# Patient Record
Sex: Female | Born: 1970 | Race: Asian | Hispanic: No | Marital: Married | Smoking: Never smoker
Health system: Southern US, Community
[De-identification: ages and names within clinical notes are randomized; demographics above are authoritative.]

---

## 2009-04-24 ENCOUNTER — Ambulatory Visit: Payer: Self-pay | Admitting: Diagnostic Radiology

## 2009-04-24 ENCOUNTER — Ambulatory Visit (HOSPITAL_BASED_OUTPATIENT_CLINIC_OR_DEPARTMENT_OTHER): Admission: RE | Admit: 2009-04-24 | Discharge: 2009-04-24 | Payer: Self-pay | Admitting: Family Medicine

## 2018-11-28 NOTE — Progress Notes (Addendum)
[facility] [address], [address] [place], [place] [phone] [phone]  Date:  12/02/2018   Name:  Debbie EmeryChih Chuan Mendez   DOB:  January 21, 1971   MRN:  [id number]  PCP:  Pearline Cablesopland, Jessica C, MD    Chief Complaint: No chief complaint on file.   History of Present Illness:  Debbie Mendez is a 47 y.o. 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, she is studying online mostly She has 2 boys; they are 5417 and 4415 yos  She has generally been in good health Never in the hospital except for delivery No operations  She enjoys singing in her free time She takes a probioitc   Never a smoker She enjoys running for exercise  Never yet had a mammo No recent labs Most recent pap about a year ago No abnormal pap in the past  Her brother had what sounds like renal cell carcinoma but otherwise no family history of cancer  She is not fasting- had a light meal She might like to do a mammo this am as well  She may get some lower back pain prior to her menses  Menses are regular  She is not taking any medication for this   She note that she is not sleeping that well She may clench her teeth She may feel tired during the day She is getting about 7 hours of sleep She has a hard time getting her brain to shut off before bed   She thinks that she had a tetanus in 2015 Declines a flu shot   There are no active problems to display for this patient.   History reviewed. No pertinent past medical history.  History reviewed. No pertinent surgical history.  Social History   Tobacco Use  . Smoking status: Never Smoker  . Smokeless tobacco: Never Used  Substance Use Topics  . Alcohol use: Yes    Comment: very rarelly  . Drug use: Never    Family History  Problem  Relation Age of Onset  . Hypertension Mother   . Arthritis Mother   . Kidney disease Brother   . Renal cancer Brother     Not on File  Medication list has been reviewed and updated.  Current Outpatient Medications on File Prior to Visit  Medication Sig Dispense Refill  . lactobacillus acidophilus (BACID) TABS tablet Take 2 tablets by mouth 3 (three) times daily.     No current facility-administered medications on file prior to visit.     Review of Systems:  As per HPI- otherwise negative.   Physical Examination: Vitals:   12/02/18 0909  BP: 140/79  Pulse: 68  Resp: 16  Temp: 98.4 F (36.9 C)  SpO2: 98%   Vitals:   12/02/18 0909  Weight: 131 lb (59.4 kg)  Height: 5' 2.5" (1.588 m)   Body mass index is 23.58 kg/m. Ideal Body Weight: Weight in (lb) to have BMI = 25: 138.6  GEN: WDWN, NAD, Non-toxic, A & O x 3, normal weight, looks well  HEENT: Atraumatic, Normocephalic. Neck supple. No masses, No LAD. Ears and Nose: No external deformity. CV: RRR, No M/G/R. No JVD. No thrill. No extra heart sounds. PULM: CTA B, no wheezes, crackles, rhonchi. No  retractions. No resp. distress. No accessory muscle use. ABD: S, NT, ND. No rebound. No HSM. No lower back tenderness to exam today  EXTR: No c/c/e NEURO Normal gait.  PSYCH: Normally interactive. Conversant. Not depressed or anxious appearing.  Calm demeanor.    Assessment and Plan: Screening for deficiency anemia - Plan: CBC  Screening for diabetes mellitus - Plan: Comprehensive metabolic panel, Hemoglobin A1c  Screening for hyperlipidemia - Plan: Lipid panel  Screening for breast cancer - Plan: MM 3D SCREEN BREAST BILATERAL  Here today as a new patient to establish care Declines flu shot today She thinks her Tdap is UTD Ordered a mammo for her today She notes some minor sleep difficulty- suggested melatonin. She will let me know if not helpful Suggested that she try ibuprofen for lower back ache with menses    Asked her to see me in 6 months for a CPE   Signed Abbe Amsterdam, MD  Received her labs, letter to pt Results for orders placed or performed in visit on 12/02/18  CBC  Result Value Ref Range   WBC 8.1 4.0 - 10.5 K/uL   RBC 4.30 3.87 - 5.11 Mil/uL   Platelets 238.0 150.0 - 400.0 K/uL   Hemoglobin 13.3 12.0 - 15.0 g/dL   HCT 78.2 95.6 - 21.3 %   MCV 92.2 78.0 - 100.0 fl   MCHC 33.4 30.0 - 36.0 g/dL   RDW 08.6 57.8 - 46.9 %  Comprehensive metabolic panel  Result Value Ref Range   Sodium 139 135 - 145 mEq/L   Potassium 4.2 3.5 - 5.1 mEq/L   Chloride 104 96 - 112 mEq/L   CO2 30 19 - 32 mEq/L   Glucose, Bld 89 70 - 99 mg/dL   BUN 12 6 - 23 mg/dL   Creatinine, Ser 6.29 0.40 - 1.20 mg/dL   Total Bilirubin 0.8 0.2 - 1.2 mg/dL   Alkaline Phosphatase 41 39 - 117 U/L   AST 15 0 - 37 U/L   ALT 16 0 - 35 U/L   Total Protein 6.8 6.0 - 8.3 g/dL   Albumin 4.2 3.5 - 5.2 g/dL   Calcium 8.8 8.4 - 52.8 mg/dL   GFR 41.32 >44.01 mL/min  Hemoglobin A1c  Result Value Ref Range   Hgb A1c MFr Bld 5.6 4.6 - 6.5 %  Lipid panel  Result Value Ref Range   Cholesterol 165 0 - 200 mg/dL   Triglycerides 02.7 0.0 - 149.0 mg/dL   HDL 25.36 >64.40 mg/dL   VLDL 34.7 0.0 - 42.5 mg/dL   LDL Cholesterol 90 0 - 99 mg/dL   Total CHOL/HDL Ratio 3    NonHDL 107.08    The 10-year ASCVD risk score Denman George DC Jr., et al., 2013) is: 0.8%   Values used to calculate the score:     Age: 78 years     Sex: Female     Is Non-Hispanic African American: No     Diabetic: No     Tobacco smoker: No     Systolic Blood Pressure: 140 mmHg     Is BP treated: No     HDL Cholesterol: 58.3 mg/dL     Total Cholesterol: 165 mg/dL

## 2018-12-02 ENCOUNTER — Ambulatory Visit (INDEPENDENT_AMBULATORY_CARE_PROVIDER_SITE_OTHER): Payer: 59 | Admitting: Family Medicine

## 2018-12-02 ENCOUNTER — Encounter: Payer: Self-pay | Admitting: Family Medicine

## 2018-12-02 VITALS — BP 140/79 | HR 68 | Temp 98.4°F | Resp 16 | Ht 62.5 in | Wt 131.0 lb

## 2018-12-02 DIAGNOSIS — Z13220 Encounter for screening for lipoid disorders: Secondary | ICD-10-CM

## 2018-12-02 DIAGNOSIS — Z130 Encounter for screening for diseases of the blood and blood-forming organs and certain disorders involving the immune mechanism: Secondary | ICD-10-CM | POA: Diagnosis not present

## 2018-12-02 DIAGNOSIS — Z131 Encounter for screening for diabetes mellitus: Secondary | ICD-10-CM

## 2018-12-02 DIAGNOSIS — Z1239 Encounter for other screening for malignant neoplasm of breast: Secondary | ICD-10-CM

## 2018-12-02 LAB — HEMOGLOBIN A1C: HEMOGLOBIN A1C: 5.6 % (ref 4.6–6.5)

## 2018-12-02 LAB — CBC
HEMATOCRIT: 39.7 % (ref 36.0–46.0)
HEMOGLOBIN: 13.3 g/dL (ref 12.0–15.0)
MCHC: 33.4 g/dL (ref 30.0–36.0)
MCV: 92.2 fl (ref 78.0–100.0)
Platelets: 238 10*3/uL (ref 150.0–400.0)
RBC: 4.3 Mil/uL (ref 3.87–5.11)
RDW: 12.9 % (ref 11.5–15.5)
WBC: 8.1 10*3/uL (ref 4.0–10.5)

## 2018-12-02 LAB — LIPID PANEL
CHOL/HDL RATIO: 3
Cholesterol: 165 mg/dL (ref 0–200)
HDL: 58.3 mg/dL (ref >39.00)
LDL Cholesterol: 90 mg/dL (ref 0–99)
NonHDL: 107.08
TRIGLYCERIDES: 87 mg/dL (ref 0.0–149.0)
VLDL: 17.4 mg/dL (ref 0.0–40.0)

## 2018-12-02 LAB — COMPREHENSIVE METABOLIC PANEL
ALK PHOS: 41 U/L (ref 39–117)
ALT: 16 U/L (ref 0–35)
AST: 15 U/L (ref 0–37)
Albumin: 4.2 g/dL (ref 3.5–5.2)
BUN: 12 mg/dL (ref 6–23)
CO2: 30 mEq/L (ref 19–32)
Calcium: 8.8 mg/dL (ref 8.4–10.5)
Chloride: 104 mEq/L (ref 96–112)
Creatinine, Ser: 0.69 mg/dL (ref 0.40–1.20)
GFR: 96.53 mL/min (ref >60.00)
GLUCOSE: 89 mg/dL (ref 70–99)
POTASSIUM: 4.2 meq/L (ref 3.5–5.1)
Sodium: 139 mEq/L (ref 135–145)
TOTAL PROTEIN: 6.8 g/dL (ref 6.0–8.3)
Total Bilirubin: 0.8 mg/dL (ref 0.2–1.2)

## 2018-12-02 NOTE — Patient Instructions (Addendum)
It was nice to meet you today- happy holidays!  I will be in touch with your labs asap Please stop by the imaging dept and schedule a mammogram  Let's plan to meet in 6 months for a physical

## 2018-12-04 ENCOUNTER — Ambulatory Visit (HOSPITAL_BASED_OUTPATIENT_CLINIC_OR_DEPARTMENT_OTHER)
Admission: RE | Admit: 2018-12-04 | Discharge: 2018-12-04 | Disposition: A | Discharge status: Home / Self Care | Payer: 59 | Source: Ambulatory Visit | Attending: Family Medicine | Admitting: Family Medicine

## 2018-12-04 DIAGNOSIS — Z1239 Encounter for other screening for malignant neoplasm of breast: Secondary | ICD-10-CM | POA: Diagnosis not present

## 2018-12-04 DIAGNOSIS — Z1231 Encounter for screening mammogram for malignant neoplasm of breast: Secondary | ICD-10-CM | POA: Diagnosis not present

## 2018-12-11 ENCOUNTER — Ambulatory Visit (HOSPITAL_BASED_OUTPATIENT_CLINIC_OR_DEPARTMENT_OTHER): Payer: 59

## 2019-12-17 NOTE — Progress Notes (Deleted)
[place] at [facility] [address], [place], [place] [address] [phone]  Date:  12/19/2019   Name:  Debbie Mendez   DOB:  07-28-1971   MRN:  [id number]  PCP:  Darreld Mclean, MD    Chief Complaint: No chief complaint on file.   History of Present Illness:  Debbie Mendez is a 48 y.o. very pleasant female patient who presents with the following:  Generally healthy young woman here today for a CPE Last seen by myself 04/2018  She is a Database administrator, has 2 teenage boys.  She enjoys running for exercise Mammogram-December 2019 Pap Flu vaccine Tetanus vaccine Colon cancer screening Routine labs-now due  There are no problems to display for this patient.   No past medical history on file.  No past surgical history on file.  Social History   Tobacco Use  . Smoking status: Never Smoker  . Smokeless tobacco: Never Used  Substance Use Topics  . Alcohol use: Yes    Comment: very rarelly  . Drug use: Never    Family History  Problem Relation Age of Onset  . Hypertension Mother   . Arthritis Mother   . Kidney disease Brother   . Renal cancer Brother     Not on File  Medication list has been reviewed and updated.  Current Outpatient Medications on File Prior to Visit  Medication Sig Dispense Refill  . lactobacillus acidophilus (BACID) TABS tablet Take 2 tablets by mouth 3 (three) times daily.     No current facility-administered medications on file prior to visit.    Review of Systems:  As per HPI- otherwise negative.   Physical Examination: There were no vitals filed for this visit. There were no vitals filed for this visit. There is no height or weight on file to calculate BMI. Ideal Body Weight:    GEN: WDWN, NAD, Non-toxic, A & O x 3 HEENT: Atraumatic, Normocephalic. Neck supple. No masses, No LAD. Ears and Nose: No external deformity. CV: RRR, No M/G/R. No JVD. No thrill. No extra  heart sounds. PULM: CTA B, no wheezes, crackles, rhonchi. No retractions. No resp. distress. No accessory muscle use. ABD: S, NT, ND, +BS. No rebound. No HSM. EXTR: No c/c/e NEURO Normal gait.  PSYCH: Normally interactive. Conversant. Not depressed or anxious appearing.  Calm demeanor.    Assessment and Plan: *** This visit occurred during the SARS-CoV-2 public health emergency.  Safety protocols were in place, including screening questions prior to the visit, additional usage of staff PPE, and extensive cleaning of exam room while observing appropriate contact time as indicated for disinfecting solutions.    Signed Lamar Blinks, MD

## 2019-12-19 ENCOUNTER — Ambulatory Visit (INDEPENDENT_AMBULATORY_CARE_PROVIDER_SITE_OTHER): Payer: 59 | Admitting: Family Medicine

## 2019-12-19 ENCOUNTER — Other Ambulatory Visit: Payer: Self-pay

## 2019-12-19 DIAGNOSIS — Z1231 Encounter for screening mammogram for malignant neoplasm of breast: Secondary | ICD-10-CM

## 2019-12-19 DIAGNOSIS — Z0000 Encounter for general adult medical examination without abnormal findings: Secondary | ICD-10-CM

## 2019-12-19 DIAGNOSIS — Z131 Encounter for screening for diabetes mellitus: Secondary | ICD-10-CM | POA: Diagnosis not present

## 2019-12-19 DIAGNOSIS — Z1211 Encounter for screening for malignant neoplasm of colon: Secondary | ICD-10-CM

## 2019-12-19 DIAGNOSIS — Z130 Encounter for screening for diseases of the blood and blood-forming organs and certain disorders involving the immune mechanism: Secondary | ICD-10-CM | POA: Diagnosis not present

## 2019-12-19 DIAGNOSIS — Z1329 Encounter for screening for other suspected endocrine disorder: Secondary | ICD-10-CM

## 2019-12-19 DIAGNOSIS — Z13220 Encounter for screening for lipoid disorders: Secondary | ICD-10-CM

## 2019-12-19 DIAGNOSIS — Z114 Encounter for screening for human immunodeficiency virus [HIV]: Secondary | ICD-10-CM

## 2019-12-22 ENCOUNTER — Other Ambulatory Visit: Payer: Self-pay

## 2019-12-22 ENCOUNTER — Ambulatory Visit (INDEPENDENT_AMBULATORY_CARE_PROVIDER_SITE_OTHER): Payer: 59 | Admitting: Family Medicine

## 2019-12-22 ENCOUNTER — Encounter: Payer: Self-pay | Admitting: Family Medicine

## 2019-12-22 DIAGNOSIS — I10 Essential (primary) hypertension: Secondary | ICD-10-CM | POA: Diagnosis not present

## 2019-12-22 MED ORDER — AMLODIPINE BESYLATE 5 MG PO TABS: 5.0000 mg | ORAL_TABLET | Freq: Every day | ORAL | 3 refills | Status: DC

## 2019-12-22 NOTE — Progress Notes (Signed)
[place] at [facility] [address], [place], [place] [address] [phone]  Date:  12/22/2019   Name:  Debbie Mendez   DOB:  1971-03-06   MRN:  [id number]  PCP:  Darreld Mclean, MD    Chief Complaint: No chief complaint on file.   History of Present Illness:  Debbie Mendez is a 48 y.o. very pleasant female patient who presents with the following:  Virtual visit today due to pandemic.  Patient location is home, provider location is home Patient identity confirmed with 2 factors, she gives consent for virtual visit today The patient and myself are present on the call today Connected via video call  We are doing a follow-up visit today Generally healthy young woman, last seen by myself about 1 year ago  She is in [place], she has 2 teenage sons age 58 and 75 Her oldest is currently applying to colleges, he hopes to go to [employer] Most recent labs 1 year ago Pap- may be due  Tetanus? Flu vaccine- done at her husband's job, in November Mammogram 1 year ago  Her main concern today is elevated blood pressure She has been checking her BP at home for the last couple of weeks She is getting readings of 140-160/85-95 Pulse running 50-60 Her mother and father have HTN- no family history of CAD however  She also mentions a feeling of tightness in her chest for a month or so Can occur any time, may last several hours or a day She denies chest pain The chest tightness may occur once a week or so She last noticed this approximately 3 weeks ago  No symptoms at this time  For exercise she will typically run 1-2x a week.   No CP or tightness with running  She does feel like she may be having some anxiety- she has a lot of pressure on her, a lot of stress Her brother was dx with stage 4 kidney cancer just recently- he lives in [place] Her father has worsening dementia- her mom and dad live in [place] as  well  She is also in school, has 2 sons 1 of whom is a high school senior this year  Her most recent weight about 130 lbs No chance of current pregnancy  BP Readings from Last 3 Encounters:  12/02/18 140/79    There are no problems to display for this patient.   No past medical history on file.  No past surgical history on file.  Social History   Tobacco Use  . Smoking status: Never Smoker  . Smokeless tobacco: Never Used  Substance Use Topics  . Alcohol use: Yes    Comment: very rarelly  . Drug use: Never    Family History  Problem Relation Age of Onset  . Hypertension Mother   . Arthritis Mother   . Kidney disease Brother   . Renal cancer Brother     Not on File  Medication list has been reviewed and updated.  Current Outpatient Medications on File Prior to Visit  Medication Sig Dispense Refill  . lactobacillus acidophilus (BACID) TABS tablet Take 2 tablets by mouth 3 (three) times daily.     No current facility-administered medications on file prior to visit.    Review of Systems:  As per HPI- otherwise negative.   Physical Examination: There were no vitals filed for this visit. There were no vitals filed for this  visit. There is no height or weight on file to calculate BMI. Ideal Body Weight:     Patient is checking blood pressure and pulse at home as described above Pt observed via video monitor- she looks well, no cough, wheezing, or shortness of breath noted  Assessment and Plan: Essential hypertension - Plan: amLODipine (NORVASC) 5 MG tablet  Virtual visit today for concern of high blood pressure.  Patient has noted moderately high blood pressure on several occasions at home.  She also had a borderline blood pressure in my office last year, family history of high blood pressure  We will have her start on amlodipine, called in 5 mg.  We will have her start with 1/2 tablet first as 2-1/2 mg may be sufficient.  I have encouraged her to continue  checking her blood pressure and please jot down some readings for me.  We will plan to bring her into the office to be seen in the next couple of weeks.  I did offer to bring her in today for EKG, chest x-ray, labs.  She declines to come into the office today.  Her most recent episode of chest tightness was about 3 weeks ago, currently asymptomatic.  I have asked her to seek immediate care if the symptoms return prior to our visit, and she agrees to do so  Signed Abbe Amsterdam, MD

## 2020-06-27 DIAGNOSIS — I10 Essential (primary) hypertension: Secondary | ICD-10-CM | POA: Insufficient documentation

## 2020-06-27 NOTE — Progress Notes (Signed)
[facility] at [facility] [address] [place], [place] [address] [phone]  Date:  06/28/2020   Name:  Debbie Mendez   DOB:  06-23-1971   MRN:  [id number]  PCP:  Pearline Cables, MD    Chief Complaint: Annual Exam (with pap)   History of Present Illness:  Debbie Mendez is a 49 y.o. very pleasant female patient who presents with the following:  Woman with history of essential hypertension, here today for physical exam Last seen by myself in December 2020 At that time she was checking her blood pressure at home, had noted some elevated numbers with systolic running 932- 160; we started her on amlodipine 5 mg; she is taking 2.5 mg now and her BP seems to be under good control   She is married with 2 teenage sons- they are doing well, 20 and 62 yo.  One is going to [employer] school this summer  Her family lives in [place], her brother there has kidney cancer.  Her parents both live in [place], the father is suffering from dementia  Pap today-she does not recall any history of abnormal Pap Mammogram- will order  Colon cancer screening-discussed options with patient today.  She wishes to think about this and will let me know if she prefers colonoscopy versus Cologuard Tetanus vaccine- boost today  Routine labs are due, most recent 2019.   Covid series complete  She enjoys running/ walking for 30 minutes about 3x a week   She does mention that she was sometimes noticed a few minutes of palpitations.  No chest pain or shortness of breath.  She notes this mostly when she is at rest, such as when she is standing.  Does not occur with exercise Patient Active Problem List   Diagnosis Date Noted  . Essential hypertension 06/27/2020    No past medical history on file.  No past surgical history on file.  Social History   Tobacco Use  . Smoking status: Never Smoker  . Smokeless tobacco: Never Used  Vaping Use  . Vaping Use:  Never used  Substance Use Topics  . Alcohol use: Yes    Comment: very rarelly  . Drug use: Never    Family History  Problem Relation Age of Onset  . Hypertension Mother   . Arthritis Mother   . Kidney disease Brother   . Renal cancer Brother     Not on File  Medication list has been reviewed and updated.  Current Outpatient Medications on File Prior to Visit  Medication Sig Dispense Refill  . amLODipine (NORVASC) 5 MG tablet Take 1 tablet (5 mg total) by mouth daily. May start with 2.5 mg (Patient taking differently: Take 2.5 mg by mouth daily. May start with 2.5 mg) 90 tablet 3  . melatonin 1 MG TABS tablet Take 3 mg by mouth at bedtime.     No current facility-administered medications on file prior to visit.    Review of Systems:  As per HPI- otherwise negative.   Physical Examination: Vitals:   06/28/20 0924  BP: 130/86  Pulse: 76  Resp: 17  SpO2: 98%   Vitals:   06/28/20 0924  Weight: 132 lb (59.9 kg)  Height: 5' 2.5" (1.588 m)   Body mass index is 23.76 kg/m. Ideal Body Weight: Weight in (lb) to have BMI = 25: 138.6  GEN: no acute distress.  Looks well, normal weight HEENT: Atraumatic, Normocephalic.  Ears and Nose: No external deformity. CV: RRR, No M/G/R. No JVD. No thrill. No extra heart sounds. PULM: CTA B, no wheezes, crackles, rhonchi. No retractions. No resp. distress. No accessory muscle use. ABD: S, NT, ND, +BS. No rebound. No HSM. EXTR: No c/c/e PSYCH: Normally interactive. Conversant.  Breast: normal exam, no masses/ dimpling/ discharge Pelvic: normal, no vaginal lesions or discharge. Uterus normal, no CMT, no adnexal tendereness or masses   EKG: Normal sinus rhythm, 1 ectopic beat caught on second tracing No old EKG for comparison   Assessment and Plan: Physical exam  Essential hypertension - Plan: CBC, Comprehensive metabolic panel  Screening for deficiency anemia - Plan: CBC  Screening for diabetes mellitus - Plan:  Comprehensive metabolic panel, Hemoglobin A1c  Screening for hyperlipidemia - Plan: Lipid panel  Screening for HIV (human immunodeficiency virus) - Plan: HIV Antibody (routine testing w rflx)  Screening for thyroid disorder - Plan: TSH  Encounter for hepatitis C screening test for low risk patient - Plan: Hepatitis C antibody  Screening for cervical cancer - Plan: Cytology - PAP  Palpitations - Plan: EKG 12-Lead  Immunization due - Plan: Td vaccine greater than or equal to 7yo preservative free IM  Encounter for screening mammogram for malignant neoplasm of breast - Plan: MM 3D SCREEN BREAST BILATERAL  Patient here today for physical exam.  Routine labs, Pap pending as above Updated tetanus Ordered mammogram Patient wished to think about her colon cancer screening options, she will let me know how she would like to proceed  Discussed her occasional palpitations.  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  For the time being she declines, she will let me know if the symptoms worsen or if she develops any symptoms during exercise  Will plan further follow- up pending labs.  This visit occurred during the SARS-CoV-2 public health emergency.  Safety protocols were in place, including screening questions prior to the visit, additional usage of staff PPE, and extensive cleaning of exam room while observing appropriate contact time as indicated for disinfecting solutions.    Signed Abbe Amsterdam, MD

## 2020-06-27 NOTE — Patient Instructions (Signed)
It was great to see you again today, I will be in touch with your labs as soon as possible Please stop by imaging on the ground floor and see if they can do your mammogram You are due for colon cancer screening   Health Maintenance, Female Adopting a healthy lifestyle and getting preventive care are important in promoting health and wellness. Ask your health care provider about:  The right schedule for you to have regular tests and exams.  Things you can do on your own to prevent diseases and keep yourself healthy. What should I know about diet, weight, and exercise? Eat a healthy diet   Eat a diet that includes plenty of vegetables, fruits, low-fat dairy products, and lean protein.  Do not eat a lot of foods that are high in solid fats, added sugars, or sodium. Maintain a healthy weight Body mass index (BMI) is used to identify weight problems. It estimates body fat based on height and weight. Your health care provider can help determine your BMI and help you achieve or maintain a healthy weight. Get regular exercise Get regular exercise. This is one of the most important things you can do for your health. Most adults should:  Exercise for at least 150 minutes each week. The exercise should increase your heart rate and make you sweat (moderate-intensity exercise).  Do strengthening exercises at least twice a week. This is in addition to the moderate-intensity exercise.  Spend less time sitting. Even light physical activity can be beneficial. Watch cholesterol and blood lipids Have your blood tested for lipids and cholesterol at 50 years of age, then have this test every 5 years. Have your cholesterol levels checked more often if:  Your lipid or cholesterol levels are high.  You are older than 50 years of age.  You are at high risk for heart disease. What should I know about cancer screening? Depending on your health history and family history, you may need to have cancer  screening at various ages. This may include screening for:  Breast cancer.  Cervical cancer.  Colorectal cancer.  Skin cancer.  Lung cancer. What should I know about heart disease, diabetes, and high blood pressure? Blood pressure and heart disease  High blood pressure causes heart disease and increases the risk of stroke. This is more likely to develop in people who have high blood pressure readings, are of African descent, or are overweight.  Have your blood pressure checked: ? Every 3-5 years if you are 5-56 years of age. ? Every year if you are 77 years old or older. Diabetes Have regular diabetes screenings. This checks your fasting blood sugar level. Have the screening done:  Once every three years after age 20 if you are at a normal weight and have a low risk for diabetes.  More often and at a younger age if you are overweight or have a high risk for diabetes. What should I know about preventing infection? Hepatitis B If you have a higher risk for hepatitis B, you should be screened for this virus. Talk with your health care provider to find out if you are at risk for hepatitis B infection. Hepatitis C Testing is recommended for:  Everyone born from [birth year] through 1966.  Anyone with known risk factors for hepatitis C. Sexually transmitted infections (STIs)  Get screened for STIs, including gonorrhea and chlamydia, if: ? You are sexually active and are younger than 50 years of age. ? You are older than 50 years of  age and your health care provider tells you that you are at risk for this type of infection. ? Your sexual activity has changed since you were last screened, and you are at increased risk for chlamydia or gonorrhea. Ask your health care provider if you are at risk.  Ask your health care provider about whether you are at high risk for HIV. Your health care provider may recommend a prescription medicine to help prevent HIV infection. If you choose to take  medicine to prevent HIV, you should first get tested for HIV. You should then be tested every 3 months for as long as you are taking the medicine. Pregnancy  If you are about to stop having your period (premenopausal) and you may become pregnant, seek counseling before you get pregnant.  Take 400 to 800 micrograms (mcg) of folic acid every day if you become pregnant.  Ask for birth control (contraception) if you want to prevent pregnancy. Osteoporosis and menopause Osteoporosis is a disease in which the bones lose minerals and strength with aging. This can result in bone fractures. If you are 37 years old or older, or if you are at risk for osteoporosis and fractures, ask your health care provider if you should:  Be screened for bone loss.  Take a calcium or vitamin D supplement to lower your risk of fractures.  Be given hormone replacement therapy (HRT) to treat symptoms of menopause. Follow these instructions at home: Lifestyle  Do not use any products that contain nicotine or tobacco, such as cigarettes, e-cigarettes, and chewing tobacco. If you need help quitting, ask your health care provider.  Do not use street drugs.  Do not share needles.  Ask your health care provider for help if you need support or information about quitting drugs. Alcohol use  Do not drink alcohol if: ? Your health care provider tells you not to drink. ? You are pregnant, may be pregnant, or are planning to become pregnant.  If you drink alcohol: ? Limit how much you use to 0-1 drink a day. ? Limit intake if you are breastfeeding.  Be aware of how much alcohol is in your drink. In the [place] one drink equals one 12 oz bottle of beer (355 mL), one 5 oz glass of wine (148 mL), or one 1 oz glass of hard liquor (44 mL). General instructions  Schedule regular health, dental, and eye exams.  Stay current with your vaccines.  Tell your health care provider if: ? You often feel depressed. ? You have  ever been abused or do not feel safe at home. Summary  Adopting a healthy lifestyle and getting preventive care are important in promoting health and wellness.  Follow your health care provider's instructions about healthy diet, exercising, and getting tested or screened for diseases.  Follow your health care provider's instructions on monitoring your cholesterol and blood pressure. This information is not intended to replace advice given to you by your health care provider. Make sure you discuss any questions you have with your health care provider. Document Revised: 12/08/2018 Document Reviewed: 12/08/2018 Elsevier Patient Education  2020 [employer].

## 2020-06-28 ENCOUNTER — Other Ambulatory Visit (HOSPITAL_COMMUNITY)
Admission: RE | Admit: 2020-06-28 | Discharge: 2020-06-28 | Disposition: A | Discharge status: Home / Self Care | Payer: 59 | Source: Ambulatory Visit | Attending: Family Medicine | Admitting: Family Medicine

## 2020-06-28 ENCOUNTER — Encounter: Payer: Self-pay | Admitting: Family Medicine

## 2020-06-28 ENCOUNTER — Ambulatory Visit (INDEPENDENT_AMBULATORY_CARE_PROVIDER_SITE_OTHER): Payer: 59 | Admitting: Family Medicine

## 2020-06-28 ENCOUNTER — Other Ambulatory Visit: Payer: Self-pay

## 2020-06-28 VITALS — BP 130/86 | HR 76 | Resp 17 | Ht 62.5 in | Wt 132.0 lb

## 2020-06-28 DIAGNOSIS — Z131 Encounter for screening for diabetes mellitus: Secondary | ICD-10-CM | POA: Diagnosis not present

## 2020-06-28 DIAGNOSIS — Z23 Encounter for immunization: Secondary | ICD-10-CM | POA: Diagnosis not present

## 2020-06-28 DIAGNOSIS — Z130 Encounter for screening for diseases of the blood and blood-forming organs and certain disorders involving the immune mechanism: Secondary | ICD-10-CM | POA: Diagnosis not present

## 2020-06-28 DIAGNOSIS — Z0000 Encounter for general adult medical examination without abnormal findings: Secondary | ICD-10-CM | POA: Diagnosis not present

## 2020-06-28 DIAGNOSIS — Z114 Encounter for screening for human immunodeficiency virus [HIV]: Secondary | ICD-10-CM

## 2020-06-28 DIAGNOSIS — Z124 Encounter for screening for malignant neoplasm of cervix: Secondary | ICD-10-CM | POA: Insufficient documentation

## 2020-06-28 DIAGNOSIS — R002 Palpitations: Secondary | ICD-10-CM | POA: Diagnosis not present

## 2020-06-28 DIAGNOSIS — Z1231 Encounter for screening mammogram for malignant neoplasm of breast: Secondary | ICD-10-CM

## 2020-06-28 DIAGNOSIS — I10 Essential (primary) hypertension: Secondary | ICD-10-CM

## 2020-06-28 DIAGNOSIS — Z1329 Encounter for screening for other suspected endocrine disorder: Secondary | ICD-10-CM

## 2020-06-28 DIAGNOSIS — Z13220 Encounter for screening for lipoid disorders: Secondary | ICD-10-CM

## 2020-06-28 DIAGNOSIS — Z1159 Encounter for screening for other viral diseases: Secondary | ICD-10-CM

## 2020-07-03 ENCOUNTER — Encounter: Payer: Self-pay | Admitting: Family Medicine

## 2020-07-03 LAB — CYTOLOGY - PAP
Comment: NEGATIVE
Diagnosis: NEGATIVE
High risk HPV: NEGATIVE

## 2020-07-05 ENCOUNTER — Ambulatory Visit (HOSPITAL_BASED_OUTPATIENT_CLINIC_OR_DEPARTMENT_OTHER)
Admission: RE | Admit: 2020-07-05 | Discharge: 2020-07-05 | Disposition: A | Discharge status: Home / Self Care | Payer: 59 | Source: Ambulatory Visit | Attending: Family Medicine | Admitting: Family Medicine

## 2020-07-05 ENCOUNTER — Other Ambulatory Visit: Payer: Self-pay

## 2020-07-05 DIAGNOSIS — Z1231 Encounter for screening mammogram for malignant neoplasm of breast: Secondary | ICD-10-CM | POA: Diagnosis present

## 2021-06-13 ENCOUNTER — Other Ambulatory Visit (HOSPITAL_BASED_OUTPATIENT_CLINIC_OR_DEPARTMENT_OTHER): Payer: Self-pay | Admitting: Family Medicine

## 2021-06-13 DIAGNOSIS — Z1231 Encounter for screening mammogram for malignant neoplasm of breast: Secondary | ICD-10-CM

## 2021-06-19 ENCOUNTER — Other Ambulatory Visit: Payer: Self-pay

## 2021-06-19 ENCOUNTER — Telehealth (INDEPENDENT_AMBULATORY_CARE_PROVIDER_SITE_OTHER): Payer: 59 | Admitting: Family Medicine

## 2021-06-19 ENCOUNTER — Encounter: Payer: Self-pay | Admitting: Family Medicine

## 2021-06-19 DIAGNOSIS — U071 COVID-19: Secondary | ICD-10-CM | POA: Diagnosis not present

## 2021-06-19 NOTE — Progress Notes (Signed)
Chief Complaint  Patient presents with   Covid Positive   Fever   Cough   Sore Throat    Debbie Mendez here for URI complaints. Due to COVID-19 pandemic, we are interacting via web portal for an electronic face-to-face visit. I verified patient's ID using 2 identifiers. Patient agreed to proceed with visit via this method. Patient is at home, I am at office. Patient and I are present for visit.   Duration: 2 days  Associated symptoms: Fever (38.5 C), sinus congestion, rhinorrhea, sore throat, myalgia, fatigue, chills, and cough Denies: sinus pain, itchy watery eyes, ear pain, ear drainage, wheezing, shortness of breath, and N/V/D, loss of taste smell Treatment to date: Tylenol, Mucinex Sick contacts: Yes; son Tested positive yesterday She is triple vaccinated; last one around Dec.   History reviewed. No pertinent past medical history.  Objective No conversational dyspnea Age appropriate judgment and insight Nml affect and mood  COVID-19  Cont Tylenol/ibuprofen prn. Discussed quarantining measures. Too young/healthy for antiviral.  Continue to push fluids, practice good hand hygiene, cover mouth when coughing. F/u prn. If starting to experience fevers, shaking, or shortness of breath, seek immediate care. Pt voiced understanding and agreement to the plan.  [name], DO 06/19/21 1:27 PM

## 2021-07-05 NOTE — Progress Notes (Addendum)
Debbie Mendez [address] [place], [place] [address] [phone]  Date:  07/11/2021   Name:  Debbie Mendez   DOB:  22-Oct-1971   MRN:  [id number]  PCP:  Debbie Cables, MD    Chief Complaint: Annual Exam (Concerns/ questions: none/Hep C screen due/Colon screen due/Zoster: none)   History of Present Illness:  Debbie Mendez is a 50 y.o. very pleasant female patient who presents with the following: Here today for a CPE Last seen by myself about one year ago- she must have forgotten to go to lab at that time  History of controlled HTN Married with two sons, ages 34 and 77 Her parents and other family live in [place] She recently graduated from [employer], she is now working as a IT sales professional.  She returned from admission to [place] recently.  She had covid last month and is now feeling better  Hep C screening Colon- no family history, discussed options, she would like cologuard  Covid booster Shingrix- she wants to think about this for now  Mammo one year ago- she has this scheduled  Pap UTD [facility] 2019- update now   Amlodipine 2.5- she is taking this daily Her BP is generally 120- 130/ 88- 90-at this time she does not want to change her dosage, will continue to monitor her BP   She notes that she sometimes has trouble sleeping- she has noticed this for a year or so, she has been under more stress the last few years with work and family She has tried melatonin which did not help that much  She would like to try trazodone  Patient Active Problem List   Diagnosis Date Noted   Essential hypertension 06/27/2020    No past medical history on file.  No past surgical history on file.  Social History   Tobacco Use   Smoking status: Never   Smokeless tobacco: Never  Vaping Use   Vaping Use: Never used  Substance Use Topics   Alcohol use: Yes    Comment: very rarelly   Drug use: Never    Family History   Problem Relation Age of Onset   Hypertension Mother    Arthritis Mother    Kidney disease Brother    Renal cancer Brother     Not on File  Medication list has been reviewed and updated.  Current Outpatient Medications on File Prior to Visit  Medication Sig Dispense Refill   amLODipine (NORVASC) 5 MG tablet Take 1 tablet (5 mg total) by mouth daily. May start with 2.5 mg (Patient taking differently: Take 2.5 mg by mouth daily. May start with 2.5 mg) 90 tablet 3   melatonin 1 MG TABS tablet Take 3 mg by mouth at bedtime.     No current facility-administered medications on file prior to visit.    Review of Systems:  As per HPI- otherwise negative.   Physical Examination: Vitals:   07/11/21 1034  BP: 130/88  Pulse: 80  Resp: 18  Temp: 98.2 F (36.8 C)  SpO2: 97%   Vitals:   07/11/21 1034  Weight: 134 lb 9.6 oz (61.1 kg)  Height: 5\' 3"  (1.6 m)   Body mass index is 23.84 kg/m. Ideal Body Weight: Weight in (lb) to have BMI = 25: 140.8  GEN: no acute distress. Normal weight. Looks well  HEENT: Atraumatic, Normocephalic.  Ears and Nose: No external deformity. CV: RRR, No M/G/R. No JVD.  No thrill. No extra heart sounds. PULM: CTA B, no wheezes, crackles, rhonchi. No retractions. No resp. distress. No accessory muscle use. ABD: S, NT, ND, +BS. No rebound. No HSM. EXTR: No c/c/e PSYCH: Normally interactive. Conversant.  Breast: normal exam, no masses/ dimpling/ discharge    Assessment and Plan: Physical exam  Screening for diabetes mellitus - Plan: Comprehensive metabolic panel, Hemoglobin A1c  Screening for hyperlipidemia - Plan: Lipid panel  Essential hypertension - Plan: CBC, Comprehensive metabolic panel  Screening for deficiency anemia - Plan: CBC  Encounter for hepatitis C screening test for low risk patient - Plan: Hepatitis C antibody  Fatigue, unspecified type - Plan: TSH, VITAMIN D 25 Hydroxy (Vit-D Deficiency, Fractures)  Primary insomnia - Plan:  traZODone (DESYREL) 50 MG tablet  Screening for colon cancer  Physical exam today Encouraged healthy diet and exercise routine Will plan further follow- up pending labs. Try trazodone for sleep Order cologuard for her  Continue amlodipine   This visit occurred during the SARS-CoV-2 public health emergency.  Safety protocols were in place, including screening questions prior to the visit, additional usage of staff PPE, and extensive cleaning of exam room while observing appropriate contact time as indicated for disinfecting solutions.   Signed Debbie Amsterdam, MD  Received her labs as below, message to pt  Results for orders placed or performed in visit on 07/11/21  CBC  Result Value Ref Range   WBC 5.3 4.0 - 10.5 K/uL   RBC 4.18 3.87 - 5.11 Mil/uL   Platelets 246.0 150.0 - 400.0 K/uL   Hemoglobin 13.1 12.0 - 15.0 g/dL   HCT 32.9 92.4 - 26.8 %   MCV 92.1 78.0 - 100.0 fl   MCHC 34.0 30.0 - 36.0 g/dL   RDW 34.1 96.2 - 22.9 %  Comprehensive metabolic panel  Result Value Ref Range   Sodium 138 135 - 145 mEq/L   Potassium 4.0 3.5 - 5.1 mEq/L   Chloride 104 96 - 112 mEq/L   CO2 26 19 - 32 mEq/L   Glucose, Bld 88 70 - 99 mg/dL   BUN 15 6 - 23 mg/dL   Creatinine, Ser 7.98 0.40 - 1.20 mg/dL   Total Bilirubin 1.2 0.2 - 1.2 mg/dL   Alkaline Phosphatase 44 39 - 117 U/L   AST 15 0 - 37 U/L   ALT 12 0 - 35 U/L   Total Protein 7.3 6.0 - 8.3 g/dL   Albumin 4.5 3.5 - 5.2 g/dL   GFR 92.11 >94.17 mL/min   Calcium 9.0 8.4 - 10.5 mg/dL  TSH  Result Value Ref Range   TSH 0.56 0.35 - 5.50 uIU/mL  VITAMIN D 25 Hydroxy (Vit-D Deficiency, Fractures)  Result Value Ref Range   VITD 27.45 (L) 30.00 - 100.00 ng/mL  Lipid panel  Result Value Ref Range   Cholesterol 192 0 - 200 mg/dL   Triglycerides 408.1 (H) 0.0 - 149.0 mg/dL   HDL 44.81 >85.63 mg/dL   VLDL 14.9 (H) 0.0 - 70.2 mg/dL   Total CHOL/HDL Ratio 3    NonHDL 131.73   Hemoglobin A1c  Result Value Ref Range   Hgb A1c MFr Bld 5.6  4.6 - 6.5 %  LDL cholesterol, direct  Result Value Ref Range   Direct LDL 110.0 mg/dL    The 63-ZCHY ASCVD risk score Denman George DC Jr., et al., 2013) is: 1.5%   Values used to calculate the score:     Age: 35 years     Sex: Female  Is Non-Hispanic African American: No     Diabetic: No     Tobacco smoker: No     Systolic Blood Pressure: 130 mmHg     Is BP treated: Yes     HDL Cholesterol: 59.8 mg/dL     Total Cholesterol: 192 mg/dL

## 2021-07-05 NOTE — Patient Instructions (Signed)
Good to see you again today, I will be in touch with your labs asap  You can try trazodone as needed for insomnia- let me know how this works for you We will send you a Cologuard kit for home colon cancer screening I do recommend that we do the Shingles vaccine for you at some point  Take care!

## 2021-07-11 ENCOUNTER — Other Ambulatory Visit: Payer: Self-pay

## 2021-07-11 ENCOUNTER — Ambulatory Visit (INDEPENDENT_AMBULATORY_CARE_PROVIDER_SITE_OTHER): Payer: 59 | Admitting: Family Medicine

## 2021-07-11 ENCOUNTER — Encounter: Payer: Self-pay | Admitting: Family Medicine

## 2021-07-11 VITALS — BP 130/88 | HR 80 | Temp 98.2°F | Resp 18 | Ht 63.0 in | Wt 134.6 lb

## 2021-07-11 DIAGNOSIS — E559 Vitamin D deficiency, unspecified: Secondary | ICD-10-CM

## 2021-07-11 DIAGNOSIS — I10 Essential (primary) hypertension: Secondary | ICD-10-CM

## 2021-07-11 DIAGNOSIS — Z0000 Encounter for general adult medical examination without abnormal findings: Secondary | ICD-10-CM

## 2021-07-11 DIAGNOSIS — Z1159 Encounter for screening for other viral diseases: Secondary | ICD-10-CM

## 2021-07-11 DIAGNOSIS — R5383 Other fatigue: Secondary | ICD-10-CM | POA: Diagnosis not present

## 2021-07-11 DIAGNOSIS — Z131 Encounter for screening for diabetes mellitus: Secondary | ICD-10-CM

## 2021-07-11 DIAGNOSIS — F5101 Primary insomnia: Secondary | ICD-10-CM

## 2021-07-11 DIAGNOSIS — Z1211 Encounter for screening for malignant neoplasm of colon: Secondary | ICD-10-CM

## 2021-07-11 DIAGNOSIS — Z13220 Encounter for screening for lipoid disorders: Secondary | ICD-10-CM

## 2021-07-11 DIAGNOSIS — Z130 Encounter for screening for diseases of the blood and blood-forming organs and certain disorders involving the immune mechanism: Secondary | ICD-10-CM | POA: Diagnosis not present

## 2021-07-11 LAB — LDL CHOLESTEROL, DIRECT: Direct LDL: 110 mg/dL

## 2021-07-11 LAB — LIPID PANEL
Cholesterol: 192 mg/dL (ref 0–200)
HDL: 59.8 mg/dL (ref >39.00)
NonHDL: 131.73
Total CHOL/HDL Ratio: 3
Triglycerides: 224 mg/dL — ABNORMAL HIGH (ref 0.0–149.0)
VLDL: 44.8 mg/dL — ABNORMAL HIGH (ref 0.0–40.0)

## 2021-07-11 LAB — COMPREHENSIVE METABOLIC PANEL
ALT: 12 U/L (ref 0–35)
AST: 15 U/L (ref 0–37)
Albumin: 4.5 g/dL (ref 3.5–5.2)
Alkaline Phosphatase: 44 U/L (ref 39–117)
BUN: 15 mg/dL (ref 6–23)
CO2: 26 mEq/L (ref 19–32)
Calcium: 9 mg/dL (ref 8.4–10.5)
Chloride: 104 mEq/L (ref 96–112)
Creatinine, Ser: 0.77 mg/dL (ref 0.40–1.20)
GFR: 89.86 mL/min (ref >60.00)
Glucose, Bld: 88 mg/dL (ref 70–99)
Potassium: 4 mEq/L (ref 3.5–5.1)
Sodium: 138 mEq/L (ref 135–145)
Total Bilirubin: 1.2 mg/dL (ref 0.2–1.2)
Total Protein: 7.3 g/dL (ref 6.0–8.3)

## 2021-07-11 LAB — HEMOGLOBIN A1C: Hgb A1c MFr Bld: 5.6 % (ref 4.6–6.5)

## 2021-07-11 LAB — TSH: TSH: 0.56 u[IU]/mL (ref 0.35–5.50)

## 2021-07-11 LAB — CBC
HCT: 38.5 % (ref 36.0–46.0)
Hemoglobin: 13.1 g/dL (ref 12.0–15.0)
MCHC: 34 g/dL (ref 30.0–36.0)
MCV: 92.1 fl (ref 78.0–100.0)
Platelets: 246 10*3/uL (ref 150.0–400.0)
RBC: 4.18 Mil/uL (ref 3.87–5.11)
RDW: 13.2 % (ref 11.5–15.5)
WBC: 5.3 10*3/uL (ref 4.0–10.5)

## 2021-07-11 LAB — VITAMIN D 25 HYDROXY (VIT D DEFICIENCY, FRACTURES): VITD: 27.45 ng/mL — ABNORMAL LOW (ref 30.00–100.00)

## 2021-07-11 MED ORDER — VITAMIN D3 1.25 MG (50000 UT) PO CAPS: ORAL_CAPSULE | ORAL | 0 refills | Status: AC

## 2021-07-11 MED ORDER — TRAZODONE HCL 50 MG PO TABS: 25.0000 mg | ORAL_TABLET | Freq: Every evening | ORAL | 3 refills | Status: DC | PRN

## 2021-07-11 NOTE — Addendum Note (Signed)
Addended by: Abbe Amsterdam C on: 07/11/2021 06:13 PM   Modules accepted: Orders

## 2021-07-12 LAB — HEPATITIS C ANTIBODY
Hepatitis C Ab: NONREACTIVE
SIGNAL TO CUT-OFF: 0.02 (ref <1.00)

## 2021-07-15 ENCOUNTER — Encounter (HOSPITAL_BASED_OUTPATIENT_CLINIC_OR_DEPARTMENT_OTHER): Payer: Self-pay

## 2021-07-15 ENCOUNTER — Other Ambulatory Visit: Payer: Self-pay

## 2021-07-15 ENCOUNTER — Ambulatory Visit (HOSPITAL_BASED_OUTPATIENT_CLINIC_OR_DEPARTMENT_OTHER)
Admission: RE | Admit: 2021-07-15 | Discharge: 2021-07-15 | Disposition: A | Discharge status: Home / Self Care | Payer: 59 | Source: Ambulatory Visit | Attending: Family Medicine | Admitting: Family Medicine

## 2021-07-15 DIAGNOSIS — Z1231 Encounter for screening mammogram for malignant neoplasm of breast: Secondary | ICD-10-CM | POA: Diagnosis present

## 2021-08-19 LAB — COLOGUARD: Cologuard: NEGATIVE

## 2022-01-20 ENCOUNTER — Ambulatory Visit (INDEPENDENT_AMBULATORY_CARE_PROVIDER_SITE_OTHER): Payer: 59 | Admitting: Family Medicine

## 2022-01-20 ENCOUNTER — Encounter: Payer: Self-pay | Admitting: Family Medicine

## 2022-01-20 VITALS — BP 136/86 | HR 60 | Temp 98.2°F | Ht 62.0 in | Wt 135.4 lb

## 2022-01-20 DIAGNOSIS — I10 Essential (primary) hypertension: Secondary | ICD-10-CM

## 2022-01-20 DIAGNOSIS — M545 Low back pain, unspecified: Secondary | ICD-10-CM | POA: Diagnosis not present

## 2022-01-20 MED ORDER — MELOXICAM 15 MG PO TABS: 15.0000 mg | ORAL_TABLET | Freq: Every day | ORAL | 0 refills | Status: DC

## 2022-01-20 MED ORDER — AMLODIPINE BESYLATE 5 MG PO TABS: 5.0000 mg | ORAL_TABLET | Freq: Every day | ORAL | 3 refills | Status: DC

## 2022-01-20 MED ORDER — TIZANIDINE HCL 4 MG PO TABS: 4.0000 mg | ORAL_TABLET | Freq: Four times a day (QID) | ORAL | 0 refills | Status: DC | PRN

## 2022-01-20 NOTE — Progress Notes (Signed)
Musculoskeletal Exam  Patient: Debbie Mendez DOB: March 10, 1971  DOS: 01/20/2022  SUBJECTIVE:  Chief Complaint:   Chief Complaint  Patient presents with   Back Pain    Debbie Mendez is a 51 y.o.  female for evaluation and treatment of back pain.   Onset:  2 weeks ago. No recent inj or change in activity.  Location: lower L Character:  aching and sharp  Progression of issue:  has worsened Associated symptoms: numbness on bottom of feet Denies bowel/bladder incontinence or weakness, bruising, redness, swelling Treatment: to date has been acetaminophen, heat.   Neurovascular symptoms: no  History reviewed. No pertinent past medical history.  Objective:  VITAL SIGNS: BP 136/86    Pulse 60    Temp 98.2 F (36.8 C) (Oral)    Ht 5\' 2"  (1.575 m)    Wt 135 lb 6 oz (61.4 kg)    SpO2 99%    BMI 24.76 kg/m  Constitutional: Well formed, well developed. No acute distress. HENT: Normocephalic, atraumatic.  Thorax & Lungs:  No accessory muscle use Musculoskeletal: low back.   Tenderness to palpation: yes over LL parasp msc Deformity: no Ecchymosis: no Straight leg test: negative for OK hamstring flexibility b/l. Neurologic: Normal sensory function. No focal deficits noted. DTR's equal and symmetric in LE's. No clonus. Psychiatric: Normal mood. Age appropriate judgment and insight. Alert & oriented x 3.    Assessment:  Acute left-sided low back pain without sciatica - Plan: meloxicam (MOBIC) 15 MG tablet, tiZANidine (ZANAFLEX) 4 MG tablet  Essential hypertension - Plan: amLODipine (NORVASC) 5 MG tablet  Plan: Stretches/exercises, heat, ice, Tylenol, NSAIDs. PT if no improvement.  F/u prn. The patient voiced understanding and agreement to the plan.   [place], DO 01/20/22  2:45 PM

## 2022-01-20 NOTE — Patient Instructions (Signed)

## 2022-01-24 ENCOUNTER — Other Ambulatory Visit: Payer: Self-pay | Admitting: Family Medicine

## 2022-01-24 DIAGNOSIS — M545 Low back pain, unspecified: Secondary | ICD-10-CM

## 2022-01-25 ENCOUNTER — Encounter: Payer: Self-pay | Admitting: Family Medicine

## 2022-02-16 ENCOUNTER — Other Ambulatory Visit: Payer: Self-pay | Admitting: Family Medicine

## 2022-02-16 DIAGNOSIS — M545 Low back pain, unspecified: Secondary | ICD-10-CM

## 2022-02-25 ENCOUNTER — Encounter: Payer: Self-pay | Admitting: Family Medicine

## 2022-05-30 IMAGING — MG MM DIGITAL SCREENING BILAT W/ TOMO AND CAD
8 series · 9 of 24 positions shown · non-contrast
Comparison: Previous exam(s).

CLINICAL DATA: Screening.

EXAM:
DIGITAL SCREENING BILATERAL MAMMOGRAM WITH TOMOSYNTHESIS AND CAD
TECHNIQUE: Bilateral screening digital craniocaudal and mediolateral oblique
mammograms were obtained. Bilateral screening digital breast
tomosynthesis was performed. The images were evaluated with
computer-aided detection.

[R MLO synth-2D]
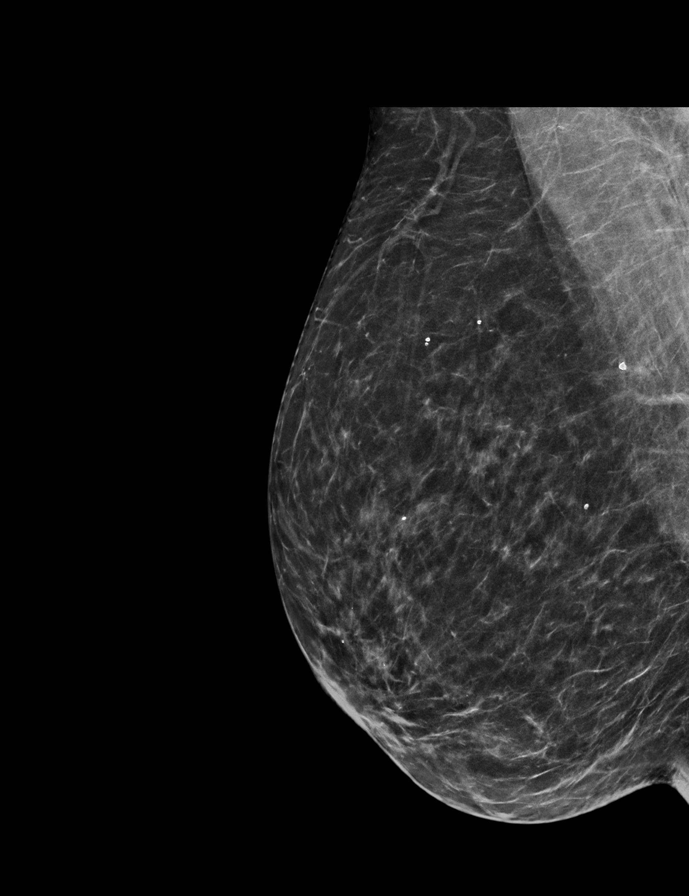

[L MLO synth-2D]
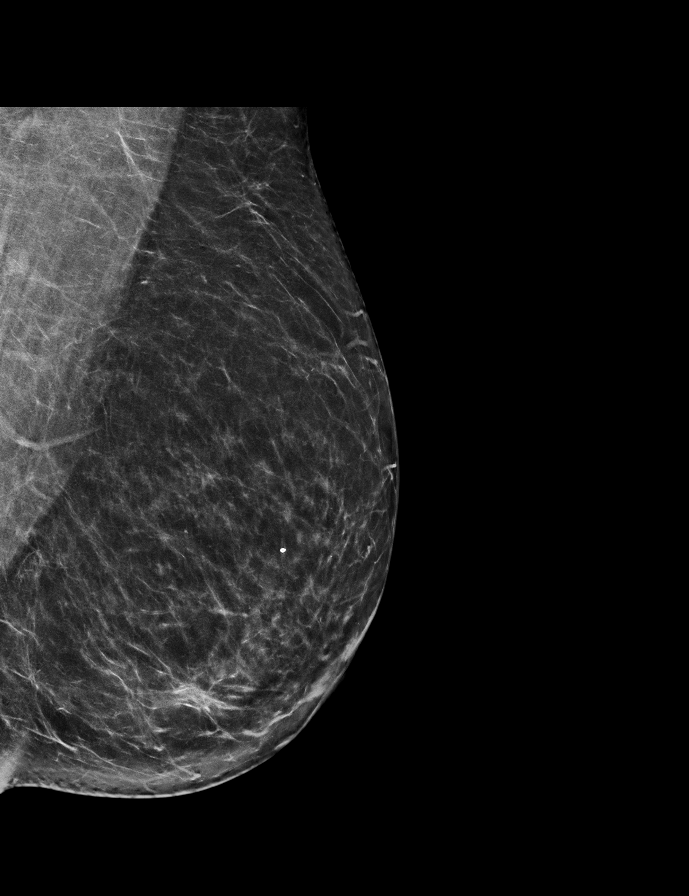

[L CC synth-2D]
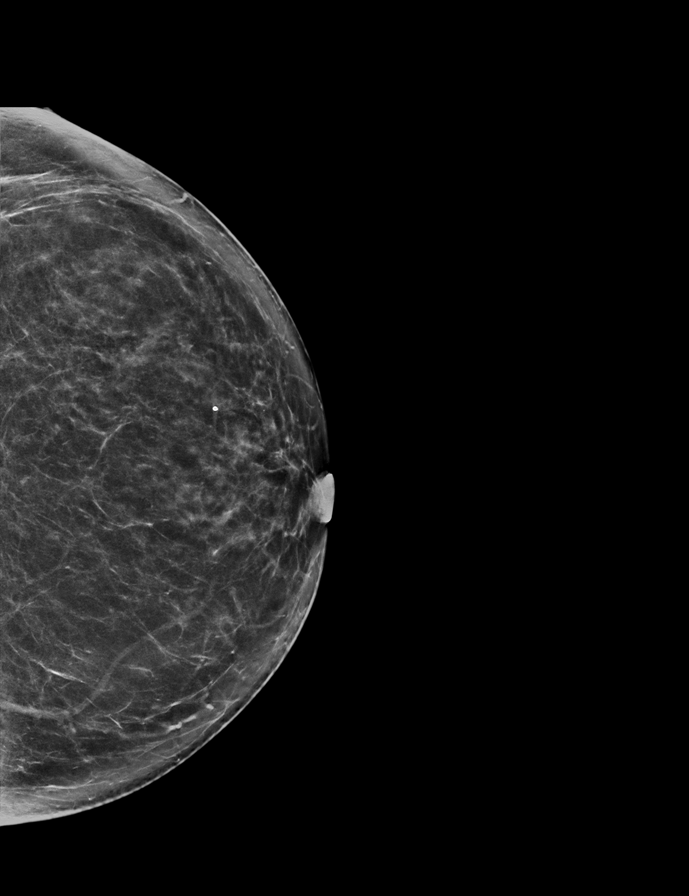

[R CC synth-2D]
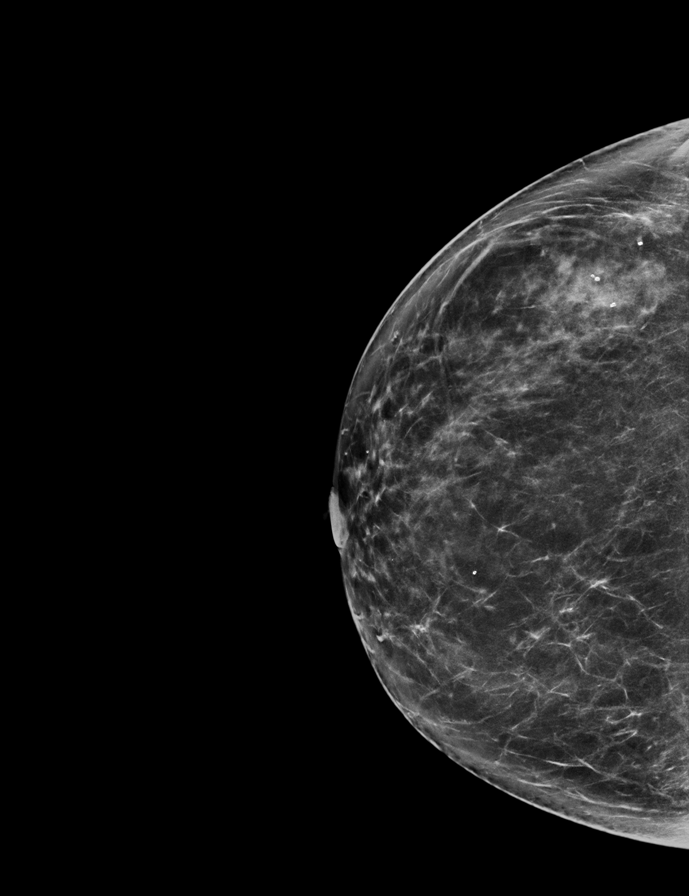

[L CC tomo · 2 of 72 frames shown]
[frame 24/72]
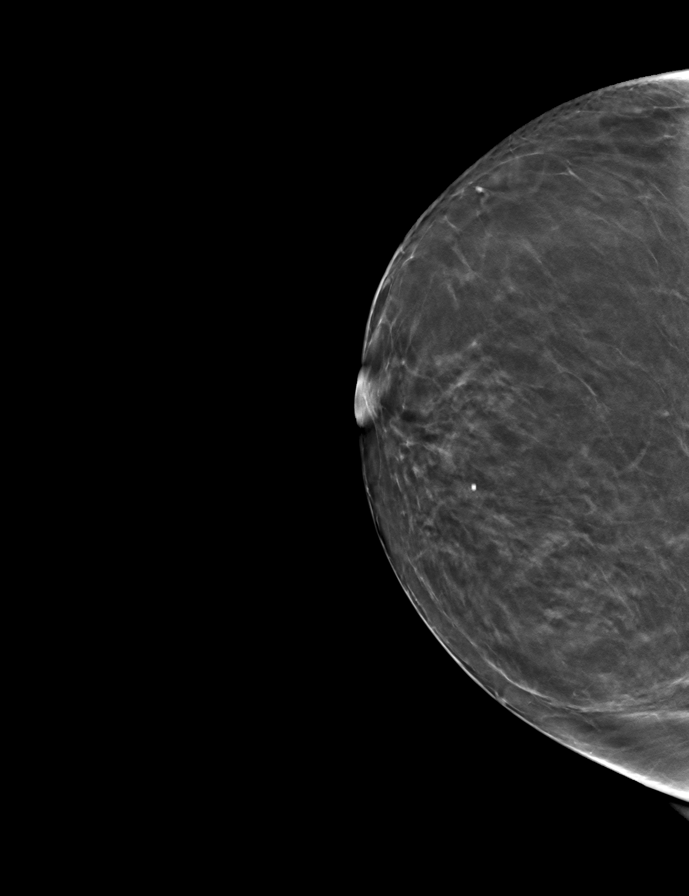
[frame 37/72]
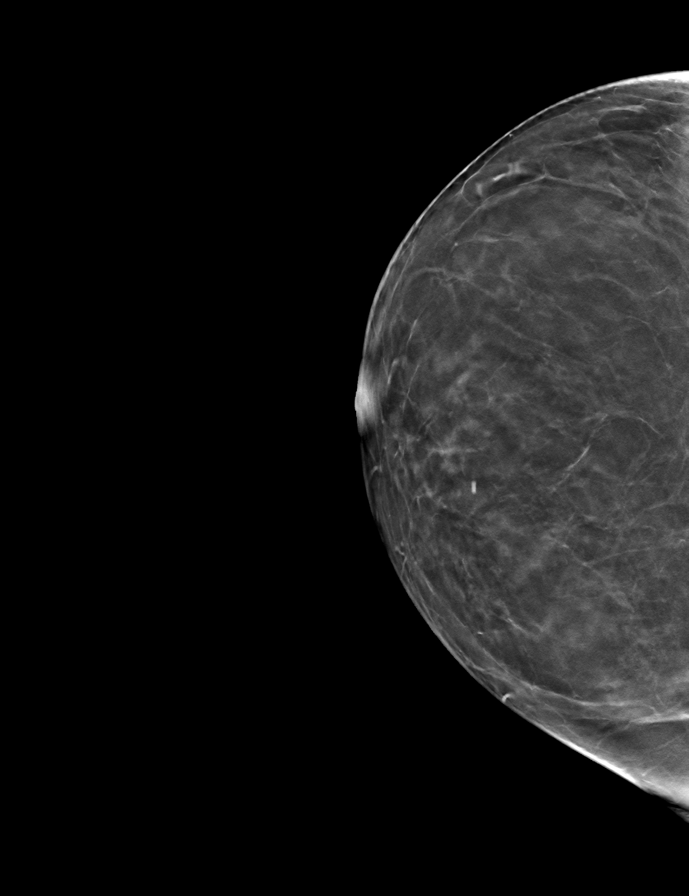

[L MLO tomo · tomo slice 35/70.0]
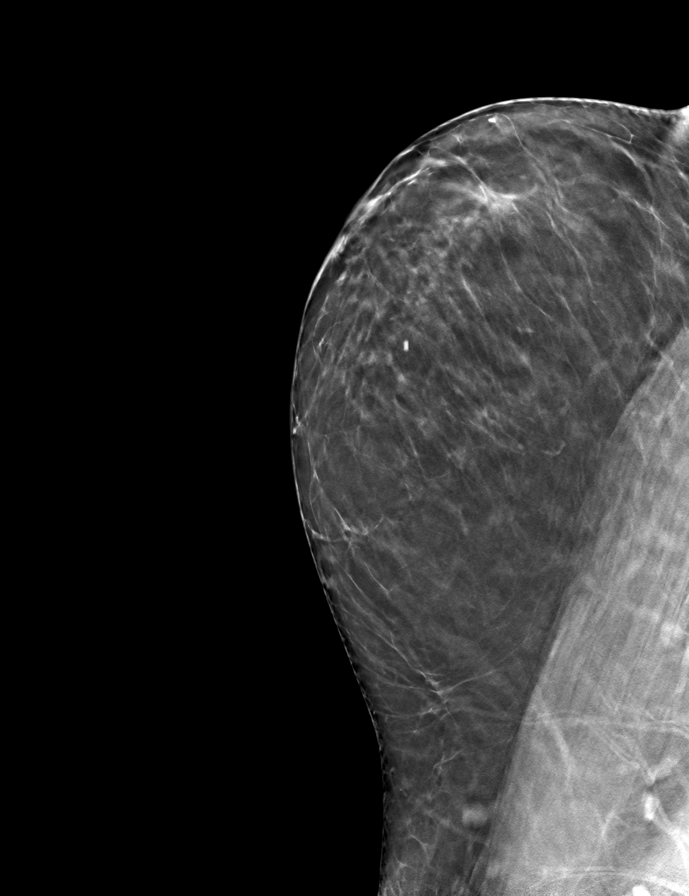

[R MLO tomo · tomo slice 35/68.0]
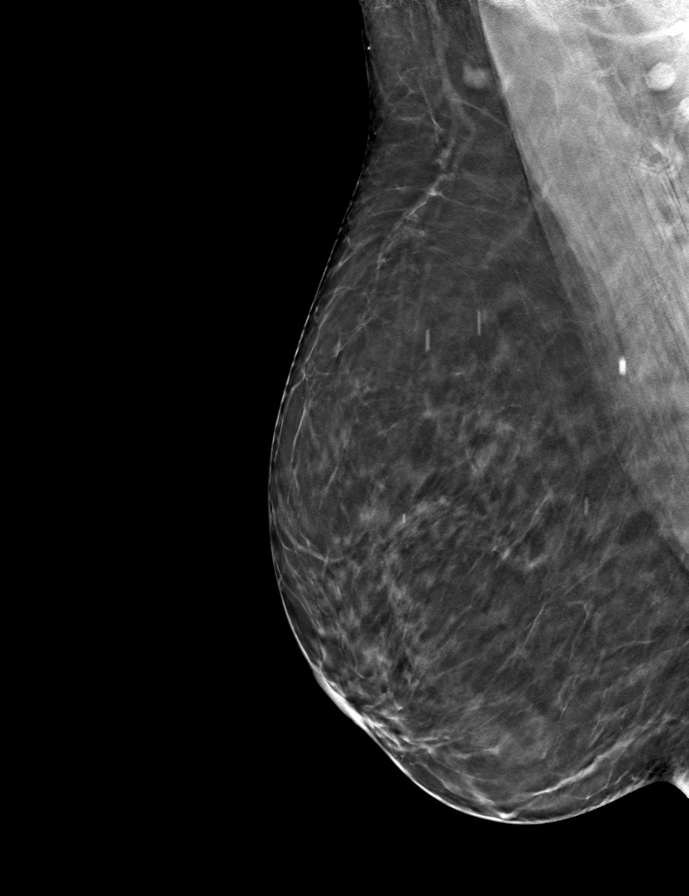

[R CC tomo · tomo slice 35/70.0]
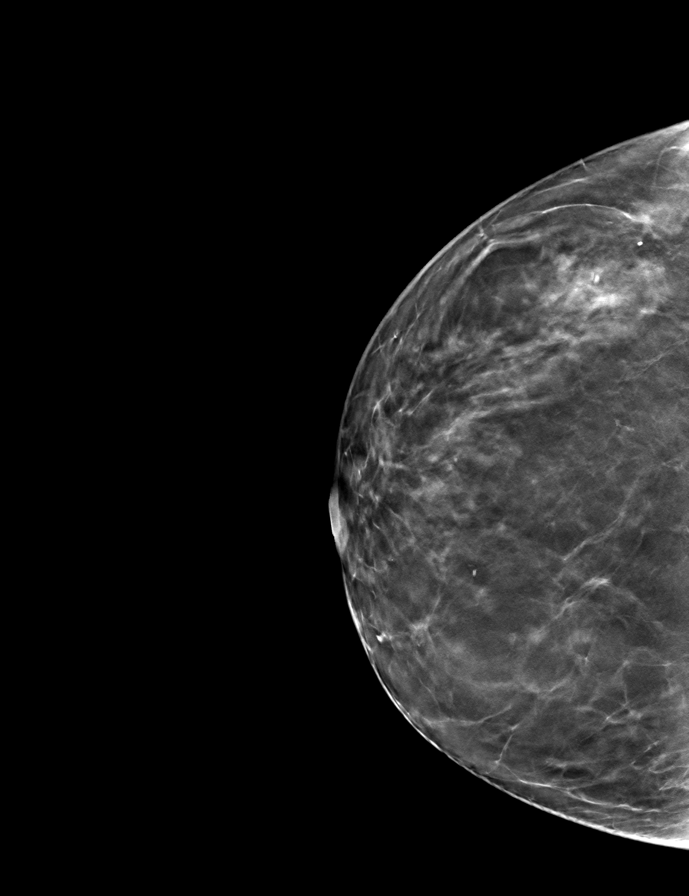

[9 of 24 positions shown; findings below may reference images not displayed]

ACR Breast Density Category b: There are scattered areas of
fibroglandular density.
FINDINGS: There are no findings suspicious for malignancy.
IMPRESSION: No mammographic evidence of malignancy. A result letter of this
screening mammogram will be mailed directly to the patient.

RECOMMENDATION:
Screening mammogram in one year. (Code:51-O-LD2)

BI-RADS CATEGORY  1: Negative.

## 2022-07-02 ENCOUNTER — Other Ambulatory Visit (HOSPITAL_BASED_OUTPATIENT_CLINIC_OR_DEPARTMENT_OTHER): Payer: Self-pay | Admitting: Family Medicine

## 2022-07-02 DIAGNOSIS — Z1231 Encounter for screening mammogram for malignant neoplasm of breast: Secondary | ICD-10-CM

## 2022-07-17 NOTE — Progress Notes (Addendum)
[facility] [address] [place], [place] [address] [phone]  Date:  07/23/2022   Name:  Debbie Mendez   DOB:  [birth year]   MRN:  [id number]  PCP:  Pearline Cables, MD    Chief Complaint: Annual Exam (Concerns/ questions: 1. taking ibuprofen for neck, shoulder, back , pain 2.Dizziness after traveling./Shingrix: none yet)   History of Present Illness:  Debbie Mendez is a 51 y.o. very pleasant female patient who presents with the following:  Patient seen today for physical exam Generally healthy, history of hypertension and vitamin D deficiency Most recent visit with myself about 1 year ago  She is married with 2 young adult sons- one is at [employer] and one at [employer] They are both doing great!  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; she recently worked in [place] in [place]  She is enjoying her work although it can be tiring She enjoys the travel but she may get some neck and back pain Her parents, other family members are still in [place]- her father passed away since our last visit  At her last visit we tried trazodone for insomnia; however she never ended up using this   Shingrix-recommended, she would like to start today Pap 2021, negative Mammogram done this am  Can update labs today  She is using amlodipine for HTN- tolerating well, no SE noted  She has noted some vertigo for the last couple of weeks; it sounds most compatible with BPPV No tinnitus noted, no hearing change   Also, she has recently noted some discomfort in her bilateral great toes, at the first MCP joint Patient Active Problem List   Diagnosis Date Noted   Essential hypertension 06/27/2020    No past medical history on file.  No past surgical history on file.  Social History   Tobacco Use   Smoking status: Never   Smokeless  tobacco: Never  Vaping Use   Vaping Use: Never used  Substance Use Topics   Alcohol use: Yes    Comment: very rarelly   Drug use: Never    Family History  Problem Relation Age of Onset   Hypertension Mother    Arthritis Mother    Kidney disease Brother    Renal cancer Brother     No Known Allergies  Medication list has been reviewed and updated.  Current Outpatient Medications on File Prior to Visit  Medication Sig Dispense Refill   amLODipine (NORVASC) 5 MG tablet Take 1 tablet (5 mg total) by mouth daily. 90 tablet 3   Cholecalciferol (VITAMIN D3) 1.25 MG (50000 UT) CAPS Take 1 weekly for 12 weeks 12 capsule 0   melatonin 1 MG TABS tablet Take 3 mg by mouth at bedtime.     No current facility-administered medications on file prior to visit.    Review of Systems:  As per HPI- otherwise negative. LMP is current   Physical Examination: Vitals:   07/23/22 1043  BP: 138/80  Pulse: 60  Resp: 18  Temp: 97.6 F (36.4 C)  SpO2: 98%   Vitals:   07/23/22 1043  Weight: 136 lb 3.2 oz (61.8 kg)  Height: 5\' 2"  (1.575 m)   Body mass index is 24.91 kg/m. Ideal Body Weight: Weight in (lb) to have BMI = 25: 136.4  GEN:  no acute distress.  Normal weight, looks well HEENT: Atraumatic, Normocephalic.  Bilateral TM wnl, oropharynx normal.  PEERL,EOMI.   Ears and Nose: No external deformity. CV: RRR, No M/G/R. No JVD. No thrill. No extra heart sounds. PULM: CTA B, no wheezes, crackles, rhonchi. No retractions. No resp. distress. No accessory muscle use. ABD: S, NT, ND, +BS. No rebound. No HSM. EXTR: No c/c/e PSYCH: Normally interactive. Conversant.  Normal strength, sensation, DTR of all extremities.  Negative Romberg.  Normal facial motion and sensation.  Positive Dix-Hallpike Foot exam normal bilaterally, perhaps some mild thickening of the first MCP joints  Assessment and Plan: Physical exam  Essential hypertension - Plan: CBC, Comprehensive metabolic  panel  Screening for hyperlipidemia - Plan: Lipid panel  Screening for deficiency anemia - Plan: CBC  Screening for diabetes mellitus - Plan: Comprehensive metabolic panel, Hemoglobin A1c  Screening for colon cancer  Vitamin D deficiency - Plan: VITAMIN D 25 Hydroxy (Vit-D Deficiency, Fractures)  Screening for thyroid disorder - Plan: TSH  Immunization due - Plan: Zoster Recombinant (Shingrix )  Great toe pain, unspecified laterality - Plan: DG Foot 2 Views Left, DG Foot 2 Views Right, Uric acid  Benign paroxysmal positional vertigo, unspecified laterality - Plan: meclizine (ANTIVERT) 25 MG tablet  Physical exam today.  Encouraged healthy diet and exercise routine Blood pressure under good control Start Shingrix Discussed likely BPPV vertigo.  Gave handout on Epley maneuver and prescription for meclizine to use as needed-please let me know if this is not getting better Discussed toe pain.  Suspect this is osteoarthritis, also consider gout.  Ordered x-rays to have done at her convenience.  Check uric acid.  Suggest Tylenol and Voltaren gel Start Shingrix Cologuard is negative and up-to-date Signed Abbe Amsterdam, MD  Received labs, message to pt  Results for orders placed or performed in visit on 07/23/22  CBC  Result Value Ref Range   WBC 6.3 4.0 - 10.5 K/uL   RBC 4.50 3.87 - 5.11 Mil/uL   Platelets 255.0 150.0 - 400.0 K/uL   Hemoglobin 13.8 12.0 - 15.0 g/dL   HCT 71.2 45.8 - 09.9 %   MCV 92.3 78.0 - 100.0 fl   MCHC 33.3 30.0 - 36.0 g/dL   RDW 83.3 82.5 - 05.3 %  Comprehensive metabolic panel  Result Value Ref Range   Sodium 137 135 - 145 mEq/L   Potassium 4.6 3.5 - 5.1 mEq/L   Chloride 101 96 - 112 mEq/L   CO2 31 19 - 32 mEq/L   Glucose, Bld 93 70 - 99 mg/dL   BUN 19 6 - 23 mg/dL   Creatinine, Ser 9.76 0.40 - 1.20 mg/dL   Total Bilirubin 1.1 0.2 - 1.2 mg/dL   Alkaline Phosphatase 51 39 - 117 U/L   AST 17 0 - 37 U/L   ALT 16 0 - 35 U/L   Total Protein 7.8 6.0  - 8.3 g/dL   Albumin 4.8 3.5 - 5.2 g/dL   GFR 73.41 >93.79 mL/min   Calcium 9.7 8.4 - 10.5 mg/dL  Hemoglobin K2I  Result Value Ref Range   Hgb A1c MFr Bld 5.6 4.6 - 6.5 %  Lipid panel  Result Value Ref Range   Cholesterol 229 (H) 0 - 200 mg/dL   Triglycerides 097.3 (H) 0.0 - 149.0 mg/dL   HDL 53.29 >92.42 mg/dL   VLDL 68.3 0.0 - 41.9 mg/dL   LDL Cholesterol 622 (H) 0 - 99 mg/dL   Total CHOL/HDL Ratio 3  NonHDL 158.49   TSH  Result Value Ref Range   TSH 1.26 0.35 - 5.50 uIU/mL  VITAMIN D 25 Hydroxy (Vit-D Deficiency, Fractures)  Result Value Ref Range   VITD 25.51 (L) 30.00 - 100.00 ng/mL  Uric acid  Result Value Ref Range   Uric Acid, Serum 5.3 2.4 - 7.0 mg/dL

## 2022-07-17 NOTE — Patient Instructions (Addendum)
It was great to see you again today, I will be in touch with your labs asap Shingles vaccine today- 2nd dose in 2-6 months as a lab visit only I ordered x-rays of your toes which can be done at your convenience.  However, please do these a different day so as to avoid any billing problems with your physical  For pain- try tylenol and/ or voltaren gel OTC as needed   I gave you some medication called meclizine to use as needed for dizziness - please let me know if not clearing up son I also gave you a hand out of a home treatment you can try for the dizziness

## 2022-07-23 ENCOUNTER — Ambulatory Visit (INDEPENDENT_AMBULATORY_CARE_PROVIDER_SITE_OTHER): Payer: 59 | Admitting: Family Medicine

## 2022-07-23 ENCOUNTER — Encounter (HOSPITAL_BASED_OUTPATIENT_CLINIC_OR_DEPARTMENT_OTHER): Payer: Self-pay

## 2022-07-23 ENCOUNTER — Encounter: Payer: Self-pay | Admitting: Family Medicine

## 2022-07-23 ENCOUNTER — Ambulatory Visit (HOSPITAL_BASED_OUTPATIENT_CLINIC_OR_DEPARTMENT_OTHER)
Admission: RE | Admit: 2022-07-23 | Discharge: 2022-07-23 | Disposition: A | Discharge status: Home / Self Care | Payer: 59 | Source: Ambulatory Visit | Attending: Family Medicine | Admitting: Family Medicine

## 2022-07-23 VITALS — BP 138/80 | HR 60 | Temp 97.6°F | Resp 18 | Ht 62.0 in | Wt 136.2 lb

## 2022-07-23 DIAGNOSIS — Z131 Encounter for screening for diabetes mellitus: Secondary | ICD-10-CM | POA: Diagnosis not present

## 2022-07-23 DIAGNOSIS — Z1329 Encounter for screening for other suspected endocrine disorder: Secondary | ICD-10-CM | POA: Diagnosis not present

## 2022-07-23 DIAGNOSIS — I10 Essential (primary) hypertension: Secondary | ICD-10-CM

## 2022-07-23 DIAGNOSIS — Z0000 Encounter for general adult medical examination without abnormal findings: Secondary | ICD-10-CM

## 2022-07-23 DIAGNOSIS — M79676 Pain in unspecified toe(s): Secondary | ICD-10-CM | POA: Diagnosis not present

## 2022-07-23 DIAGNOSIS — Z1231 Encounter for screening mammogram for malignant neoplasm of breast: Secondary | ICD-10-CM | POA: Insufficient documentation

## 2022-07-23 DIAGNOSIS — H8110 Benign paroxysmal vertigo, unspecified ear: Secondary | ICD-10-CM

## 2022-07-23 DIAGNOSIS — Z23 Encounter for immunization: Secondary | ICD-10-CM | POA: Diagnosis not present

## 2022-07-23 DIAGNOSIS — Z1211 Encounter for screening for malignant neoplasm of colon: Secondary | ICD-10-CM

## 2022-07-23 DIAGNOSIS — Z130 Encounter for screening for diseases of the blood and blood-forming organs and certain disorders involving the immune mechanism: Secondary | ICD-10-CM | POA: Diagnosis not present

## 2022-07-23 DIAGNOSIS — E559 Vitamin D deficiency, unspecified: Secondary | ICD-10-CM

## 2022-07-23 DIAGNOSIS — Z13220 Encounter for screening for lipoid disorders: Secondary | ICD-10-CM

## 2022-07-23 LAB — LIPID PANEL
Cholesterol: 229 mg/dL — ABNORMAL HIGH (ref 0–200)
HDL: 70.1 mg/dL (ref >39.00)
LDL Cholesterol: 127 mg/dL — ABNORMAL HIGH (ref 0–99)
NonHDL: 158.49
Total CHOL/HDL Ratio: 3
Triglycerides: 158 mg/dL — ABNORMAL HIGH (ref 0.0–149.0)
VLDL: 31.6 mg/dL (ref 0.0–40.0)

## 2022-07-23 LAB — COMPREHENSIVE METABOLIC PANEL
ALT: 16 U/L (ref 0–35)
AST: 17 U/L (ref 0–37)
Albumin: 4.8 g/dL (ref 3.5–5.2)
Alkaline Phosphatase: 51 U/L (ref 39–117)
BUN: 19 mg/dL (ref 6–23)
CO2: 31 mEq/L (ref 19–32)
Calcium: 9.7 mg/dL (ref 8.4–10.5)
Chloride: 101 mEq/L (ref 96–112)
Creatinine, Ser: 0.79 mg/dL (ref 0.40–1.20)
GFR: 86.5 mL/min (ref >60.00)
Glucose, Bld: 93 mg/dL (ref 70–99)
Potassium: 4.6 mEq/L (ref 3.5–5.1)
Sodium: 137 mEq/L (ref 135–145)
Total Bilirubin: 1.1 mg/dL (ref 0.2–1.2)
Total Protein: 7.8 g/dL (ref 6.0–8.3)

## 2022-07-23 LAB — CBC
HCT: 41.6 % (ref 36.0–46.0)
Hemoglobin: 13.8 g/dL (ref 12.0–15.0)
MCHC: 33.3 g/dL (ref 30.0–36.0)
MCV: 92.3 fl (ref 78.0–100.0)
Platelets: 255 10*3/uL (ref 150.0–400.0)
RBC: 4.5 Mil/uL (ref 3.87–5.11)
RDW: 13.1 % (ref 11.5–15.5)
WBC: 6.3 10*3/uL (ref 4.0–10.5)

## 2022-07-23 LAB — VITAMIN D 25 HYDROXY (VIT D DEFICIENCY, FRACTURES): VITD: 25.51 ng/mL — ABNORMAL LOW (ref 30.00–100.00)

## 2022-07-23 LAB — URIC ACID: Uric Acid, Serum: 5.3 mg/dL (ref 2.4–7.0)

## 2022-07-23 LAB — TSH: TSH: 1.26 u[IU]/mL (ref 0.35–5.50)

## 2022-07-23 LAB — HEMOGLOBIN A1C: Hgb A1c MFr Bld: 5.6 % (ref 4.6–6.5)

## 2022-07-23 MED ORDER — MECLIZINE HCL 25 MG PO TABS: 12.5000 mg | ORAL_TABLET | Freq: Three times a day (TID) | ORAL | 0 refills | Status: AC | PRN

## 2022-10-02 ENCOUNTER — Ambulatory Visit (INDEPENDENT_AMBULATORY_CARE_PROVIDER_SITE_OTHER): Payer: 59

## 2022-10-02 DIAGNOSIS — Z23 Encounter for immunization: Secondary | ICD-10-CM

## 2023-01-15 ENCOUNTER — Telehealth: Payer: Self-pay | Admitting: Family Medicine

## 2023-01-15 NOTE — Telephone Encounter (Signed)
Patient has appointment for for elevated bp, so she will wait for appt tomorrow.  She is not currently having any chest pains at this time.  Advised to go to ER immediately if she has chest pain, numbness, or worsening dizziness.

## 2023-01-15 NOTE — Telephone Encounter (Signed)
Prescription Request  01/15/2023  Is this a "Controlled Substance" medicine? No  LOV: 07/23/2022  What is the name of the medication or equipment?  amLODipine (NORVASC) 5 MG tablet   Have you contacted your pharmacy to request a refill? No   Which pharmacy would you like this sent to?  [facility] [address], [place] [address] Phone: [phone] Fax: [phone]    Patient notified that their request is being sent to the clinical staff for review and that they should receive a response within 2 business days.   Please advise at Mobile [phone] (mobile)

## 2023-01-16 ENCOUNTER — Ambulatory Visit (INDEPENDENT_AMBULATORY_CARE_PROVIDER_SITE_OTHER): Payer: 59 | Admitting: Family

## 2023-01-16 ENCOUNTER — Other Ambulatory Visit (HOSPITAL_BASED_OUTPATIENT_CLINIC_OR_DEPARTMENT_OTHER): Payer: Self-pay

## 2023-01-16 ENCOUNTER — Encounter: Payer: Self-pay | Admitting: Family

## 2023-01-16 VITALS — BP 142/78 | HR 76 | Temp 98.0°F | Ht 62.0 in | Wt 135.6 lb

## 2023-01-16 DIAGNOSIS — R030 Elevated blood-pressure reading, without diagnosis of hypertension: Secondary | ICD-10-CM

## 2023-01-16 DIAGNOSIS — I10 Essential (primary) hypertension: Secondary | ICD-10-CM | POA: Diagnosis not present

## 2023-01-16 LAB — CBC WITH DIFFERENTIAL/PLATELET
Basophils Absolute: 0.1 10*3/uL (ref 0.0–0.1)
Basophils Relative: 0.8 % (ref 0.0–3.0)
Eosinophils Absolute: 0.1 10*3/uL (ref 0.0–0.7)
Eosinophils Relative: 1.7 % (ref 0.0–5.0)
HCT: 40.6 % (ref 36.0–46.0)
Hemoglobin: 13.7 g/dL (ref 12.0–15.0)
Lymphocytes Relative: 26.9 % (ref 12.0–46.0)
Lymphs Abs: 1.9 10*3/uL (ref 0.7–4.0)
MCHC: 33.8 g/dL (ref 30.0–36.0)
MCV: 91.8 fl (ref 78.0–100.0)
Monocytes Absolute: 0.6 10*3/uL (ref 0.1–1.0)
Monocytes Relative: 8 % (ref 3.0–12.0)
Neutro Abs: 4.5 10*3/uL (ref 1.4–7.7)
Neutrophils Relative %: 62.6 % (ref 43.0–77.0)
Platelets: 275 10*3/uL (ref 150.0–400.0)
RBC: 4.42 Mil/uL (ref 3.87–5.11)
RDW: 12.9 % (ref 11.5–15.5)
WBC: 7.2 10*3/uL (ref 4.0–10.5)

## 2023-01-16 LAB — COMPREHENSIVE METABOLIC PANEL
ALT: 14 U/L (ref 0–35)
AST: 16 U/L (ref 0–37)
Albumin: 4.7 g/dL (ref 3.5–5.2)
Alkaline Phosphatase: 49 U/L (ref 39–117)
BUN: 13 mg/dL (ref 6–23)
CO2: 29 mEq/L (ref 19–32)
Calcium: 9.4 mg/dL (ref 8.4–10.5)
Chloride: 101 mEq/L (ref 96–112)
Creatinine, Ser: 0.78 mg/dL (ref 0.40–1.20)
GFR: 87.54 mL/min (ref >60.00)
Glucose, Bld: 67 mg/dL — ABNORMAL LOW (ref 70–99)
Potassium: 4.1 mEq/L (ref 3.5–5.1)
Sodium: 139 mEq/L (ref 135–145)
Total Bilirubin: 0.8 mg/dL (ref 0.2–1.2)
Total Protein: 7.7 g/dL (ref 6.0–8.3)

## 2023-01-16 MED ORDER — AMLODIPINE BESYLATE 5 MG PO TABS
5.0000 mg | ORAL_TABLET | Freq: Two times a day (BID) | ORAL | 0 refills | Status: DC
  Filled 2023-01-16: qty 60, 30d supply, fill #0
  Filled 2023-02-10: qty 60, 30d supply, fill #1
  Filled 2023-03-15: qty 60, 30d supply, fill #2

## 2023-01-16 NOTE — Progress Notes (Signed)
Debbie Mendez is a 52 y.o. female with the following history as recorded in EpicCare:  Patient Active Problem List   Diagnosis Date Noted   Essential hypertension 06/27/2020    Current Outpatient Medications  Medication Sig Dispense Refill   Cholecalciferol (VITAMIN D3) 1.25 MG (50000 UT) CAPS Take 1 weekly for 12 weeks 12 capsule 0   melatonin 1 MG TABS tablet Take 3 mg by mouth at bedtime.     amLODipine (NORVASC) 5 MG tablet Take 1 tablet (5 mg total) by mouth 2 (two) times daily. 180 tablet 0   meclizine (ANTIVERT) 25 MG tablet Take 0.5-1 tablets (12.5-25 mg total) by mouth 3 (three) times daily as needed for dizziness. (Patient not taking: Reported on 01/16/2023) 30 tablet 0   No current facility-administered medications for this visit.    Allergies: Patient has no known allergies.  No past medical history on file.  No past surgical history on file.  Family History  Problem Relation Age of Onset   Hypertension Mother    Arthritis Mother    Kidney disease Brother    Renal cancer Brother     Social History   Tobacco Use   Smoking status: Never   Smokeless tobacco: Never  Substance Use Topics   Alcohol use: Yes    Comment: very rarelly    Subjective:   Was diagnosed with tooth infection on January 09, 2023; currently on Amoxicillin 500 mg tid x 7 days for infection; on Wednesday, was seen at periodontist for pre-op exam and noted that blood pressure was elevated; was told that needed to get her blood pressure checked and better control in order to have surgery;  Yesterday blood pressure at home was 180/95; Blood pressure at home this morning was 120/78; Denies any chest pain, shortness of breath, blurred vision or headache Admits that stress level has been very high recently;     Objective:  Vitals:   01/16/23 1030  BP: (!) 142/78  Pulse: 76  Temp: 98 F (36.7 C)  TempSrc: Oral  SpO2: 98%  Weight: 135 lb 9.6 oz (61.5 kg)  Height: 5\' 2"  (1.575 m)    General:  Well developed, well nourished, in no acute distress  Skin : Warm and dry.  Head: Normocephalic and atraumatic  Eyes: Sclera and conjunctiva clear; pupils round and reactive to light; extraocular movements intact  Ears: External normal; canals clear; tympanic membranes normal  Oropharynx: Pink, supple. No suspicious lesions  Neck: Supple without thyromegaly, adenopathy  Lungs: Respirations unlabored; clear to auscultation bilaterally without wheeze, rales, rhonchi  CVS exam: normal rate and regular rhythm.  Neurologic: Alert and oriented; speech intact; face symmetrical; moves all extremities well; CNII-XII intact without focal deficit   Assessment:  1. Primary hypertension   2. Elevated blood pressure reading     Plan:  Blood pressure does not appear well controlled; Update EKG today- sinus rhythm noted; check CBC, CMP today; increase Amlodipine to 5 mg bid; continue to monitor her blood pressure at home and plan to see her PCP in 2 weeks for re-check.   Return in about 2 weeks (around 01/30/2023) for with Dr. Lorelei Pont.  Orders Placed This Encounter  Procedures   CBC with Differential/Platelet   Comp Met (CMET)   EKG 12-Lead    Requested Prescriptions   Signed Prescriptions Disp Refills   amLODipine (NORVASC) 5 MG tablet 180 tablet 0    Sig: Take 1 tablet (5 mg total) by mouth 2 (two) times daily.

## 2023-01-16 NOTE — Patient Instructions (Signed)
Please increase the Amlodipine to 5 mg twice a day as discussed. Please plan to follow up with Dr. Lorelei Pont in 2 weeks as discussed.

## 2023-01-21 ENCOUNTER — Ambulatory Visit: Payer: 59 | Admitting: Family Medicine

## 2023-01-21 ENCOUNTER — Telehealth: Payer: Self-pay | Admitting: *Deleted

## 2023-01-21 NOTE — Telephone Encounter (Signed)
Patient stated that her bp has been back to normal so she has been taking 1 tab in the morning and 1/2 tab in the evening.  Bp has been running like top numbers 120-130s over 75-78.  I advised her that her tablets are not scored so she should not break them in half and she suppose to be taking a whole tablet.  She stated that she has been feeling a little dizzy with the 1 in the morning and 1/2 in the evening.  She would like to know if she can go back to just 1 tablet at day since her bp is back to normal?  She will keep appt to see Dr. Lorelei Pont on 1/31.

## 2023-01-21 NOTE — Telephone Encounter (Signed)
Patient notified ok to take once a day.

## 2023-01-21 NOTE — Telephone Encounter (Signed)
-----  Message from Debbie Mendez, Nambe sent at 01/20/2023 11:47 AM EST ----- Her labs are normal. How is her blood pressure doing on the Amlodipine 2 x per day? Please keep planned follow up with Dr. Lorelei Pont for 1/31.

## 2023-01-26 NOTE — Patient Instructions (Incomplete)
It was good to see you today, please keep me posted about your blood pressure  Recommend a COVID booster if not done recently  [facility] to continue the 5mg  of amlodipine- your BP looks fine today!     As far as your breast/ chest wall pain; let me know if this continues, I am glad to pursue imaging with an x-ray and/ or mammogram/ breast ultrasound if this does not clear up soon

## 2023-01-26 NOTE — Progress Notes (Unsigned)
[facility] at [facility] [address], [place], [place] [address] [phone]  Date:  01/28/2023   Name:  Debbie Mendez   DOB:  November 28, 1971   MRN:  [id number]  PCP:  Darreld Mclean, MD    Chief Complaint: No chief complaint on file.   History of Present Illness:  Chih Kreg Shropshire Coriz is a 52 y.o. very pleasant female patient who presents with the following:  Pt seen today for a short term recheck  Last seen by myself in July of last year She saw my partner Jodi Mourning, nurse practitioner on January 19; she had recently gone need some dental work, her blood pressure was high at pre-op evaluation Blood work, EKG done on the 19th Increase amlodipine dose and scheduled follow-up She is currently taking a total of 10 amlodipine daily CMP, CBC on January 19 looks fine  Patient Active Problem List   Diagnosis Date Noted   Essential hypertension 06/27/2020    No past medical history on file.  No past surgical history on file.  Social History   Tobacco Use   Smoking status: Never   Smokeless tobacco: Never  Vaping Use   Vaping Use: Never used  Substance Use Topics   Alcohol use: Yes    Comment: very rarelly   Drug use: Never    Family History  Problem Relation Age of Onset   Hypertension Mother    Arthritis Mother    Kidney disease Brother    Renal cancer Brother     No Known Allergies  Medication list has been reviewed and updated.  Current Outpatient Medications on File Prior to Visit  Medication Sig Dispense Refill   amLODipine (NORVASC) 5 MG tablet Take 1 tablet (5 mg total) by mouth 2 (two) times daily. (Patient taking differently: Take 5 mg by mouth daily.) 180 tablet 0   Cholecalciferol (VITAMIN D3) 1.25 MG (50000 UT) CAPS Take 1 weekly for 12 weeks 12 capsule 0   meclizine (ANTIVERT) 25 MG tablet Take 0.5-1 tablets (12.5-25 mg total) by mouth 3 (three) times daily as needed for dizziness. (Patient not  taking: Reported on 01/16/2023) 30 tablet 0   melatonin 1 MG TABS tablet Take 3 mg by mouth at bedtime.     No current facility-administered medications on file prior to visit.    Review of Systems:  As per HPI- otherwise negative.   Physical Examination: There were no vitals filed for this visit. There were no vitals filed for this visit. There is no height or weight on file to calculate BMI. Ideal Body Weight:    GEN: no acute distress. HEENT: Atraumatic, Normocephalic.  Ears and Nose: No external deformity. CV: RRR, No M/G/R. No JVD. No thrill. No extra heart sounds. PULM: CTA B, no wheezes, crackles, rhonchi. No retractions. No resp. distress. No accessory muscle use. ABD: S, NT, ND, +BS. No rebound. No HSM. EXTR: No c/c/e PSYCH: Normally interactive. Conversant.    Assessment and Plan: ***  Signed Lamar Blinks, MD

## 2023-01-28 ENCOUNTER — Encounter: Payer: Self-pay | Admitting: Family Medicine

## 2023-01-28 ENCOUNTER — Ambulatory Visit (INDEPENDENT_AMBULATORY_CARE_PROVIDER_SITE_OTHER): Payer: 59 | Admitting: Family Medicine

## 2023-01-28 VITALS — BP 112/60 | HR 80 | Temp 98.4°F | Resp 18 | Ht 62.0 in | Wt 137.2 lb

## 2023-01-28 DIAGNOSIS — I10 Essential (primary) hypertension: Secondary | ICD-10-CM

## 2023-01-28 DIAGNOSIS — R4689 Other symptoms and signs involving appearance and behavior: Secondary | ICD-10-CM

## 2023-03-16 ENCOUNTER — Other Ambulatory Visit (HOSPITAL_BASED_OUTPATIENT_CLINIC_OR_DEPARTMENT_OTHER): Payer: Self-pay

## 2023-06-12 ENCOUNTER — Other Ambulatory Visit (HOSPITAL_BASED_OUTPATIENT_CLINIC_OR_DEPARTMENT_OTHER): Payer: Self-pay | Admitting: Family Medicine

## 2023-06-12 ENCOUNTER — Other Ambulatory Visit: Payer: Self-pay | Admitting: Family

## 2023-06-12 DIAGNOSIS — Z1231 Encounter for screening mammogram for malignant neoplasm of breast: Secondary | ICD-10-CM

## 2023-06-12 DIAGNOSIS — R030 Elevated blood-pressure reading, without diagnosis of hypertension: Secondary | ICD-10-CM

## 2023-06-15 ENCOUNTER — Other Ambulatory Visit (HOSPITAL_BASED_OUTPATIENT_CLINIC_OR_DEPARTMENT_OTHER): Payer: Self-pay

## 2023-06-17 ENCOUNTER — Other Ambulatory Visit (HOSPITAL_BASED_OUTPATIENT_CLINIC_OR_DEPARTMENT_OTHER): Payer: Self-pay

## 2023-06-17 MED ORDER — AMLODIPINE BESYLATE 5 MG PO TABS
5.0000 mg | ORAL_TABLET | Freq: Two times a day (BID) | ORAL | 0 refills | Status: DC
  Filled 2023-06-17: qty 180, 90d supply, fill #0

## 2023-06-22 ENCOUNTER — Ambulatory Visit (HOSPITAL_BASED_OUTPATIENT_CLINIC_OR_DEPARTMENT_OTHER)
Admission: RE | Admit: 2023-06-22 | Discharge: 2023-06-22 | Disposition: A | Discharge status: Home / Self Care | Payer: BLUE CROSS/BLUE SHIELD | Source: Ambulatory Visit | Attending: Family Medicine | Admitting: Family Medicine

## 2023-06-22 ENCOUNTER — Encounter (HOSPITAL_BASED_OUTPATIENT_CLINIC_OR_DEPARTMENT_OTHER): Payer: Self-pay

## 2023-06-22 DIAGNOSIS — Z1231 Encounter for screening mammogram for malignant neoplasm of breast: Secondary | ICD-10-CM | POA: Diagnosis present

## 2023-09-10 ENCOUNTER — Other Ambulatory Visit: Payer: Self-pay | Admitting: Family Medicine

## 2023-09-10 DIAGNOSIS — R030 Elevated blood-pressure reading, without diagnosis of hypertension: Secondary | ICD-10-CM

## 2023-09-11 ENCOUNTER — Other Ambulatory Visit (HOSPITAL_BASED_OUTPATIENT_CLINIC_OR_DEPARTMENT_OTHER): Payer: Self-pay

## 2023-09-11 MED ORDER — AMLODIPINE BESYLATE 5 MG PO TABS
5.0000 mg | ORAL_TABLET | Freq: Two times a day (BID) | ORAL | 0 refills | Status: DC
Start: 2023-09-11 — End: 2023-11-29
  Filled 2023-09-11: qty 180, 90d supply, fill #0

## 2023-11-29 ENCOUNTER — Other Ambulatory Visit: Payer: Self-pay | Admitting: Family Medicine

## 2023-11-29 DIAGNOSIS — R030 Elevated blood-pressure reading, without diagnosis of hypertension: Secondary | ICD-10-CM

## 2023-11-30 ENCOUNTER — Other Ambulatory Visit (HOSPITAL_BASED_OUTPATIENT_CLINIC_OR_DEPARTMENT_OTHER): Payer: Self-pay

## 2023-11-30 MED ORDER — AMLODIPINE BESYLATE 5 MG PO TABS
5.0000 mg | ORAL_TABLET | Freq: Two times a day (BID) | ORAL | 0 refills | Status: DC
Start: 2023-11-30 — End: 2024-02-24
  Filled 2023-11-30: qty 60, 30d supply, fill #0
  Filled 2024-02-12: qty 60, 30d supply, fill #1

## 2024-02-18 ENCOUNTER — Encounter: Payer: BLUE CROSS/BLUE SHIELD | Admitting: Family Medicine

## 2024-02-20 NOTE — Patient Instructions (Incomplete)
 It was great to see you today, I will be in touch with your blood work  Recommend COVID booster if none the last 6 months or so

## 2024-02-20 NOTE — Progress Notes (Unsigned)
 [facility] at [facility] [address], [address] [place], [place] [address] [phone]  Date:  02/24/2024   Name:  Merinda Victorino   DOB:  01-Oct-1971   MRN:  [id number]  PCP:  Pearline Cables, MD    Chief Complaint: No chief complaint on file.   History of Present Illness:  Chih Terrall Laity Vonbehren is a 53 y.o. very pleasant female patient who presents with the following:  Patient seen today for physical exam Most recent visit with myself was just over a year ago-at that time she had recently been diagnosed with hypertension and was doing well on 5 mg amlodipine  Flu vaccine Recommend COVID booster Cologuard will be due later this year Mammogram up-to-date Pap smear can be updated Can update blood work today Shingrix is complete Tetanus up-to-date Patient Active Problem List   Diagnosis Date Noted   Essential hypertension 06/27/2020    No past medical history on file.  No past surgical history on file.  Social History   Tobacco Use   Smoking status: Never   Smokeless tobacco: Never  Vaping Use   Vaping status: Never Used  Substance Use Topics   Alcohol use: Yes    Comment: very rarelly   Drug use: Never    Family History  Problem Relation Age of Onset   Hypertension Mother    Arthritis Mother    Kidney disease Brother    Renal cancer Brother     No Known Allergies  Medication list has been reviewed and updated.  Current Outpatient Medications on File Prior to Visit  Medication Sig Dispense Refill   amLODipine (NORVASC) 5 MG tablet Take 1 tablet (5 mg total) by mouth 2 (two) times daily. 180 tablet 0   Cholecalciferol (VITAMIN D3) 1.25 MG (50000 UT) CAPS Take 1 weekly for 12 weeks 12 capsule 0   meclizine (ANTIVERT) 25 MG tablet Take 0.5-1 tablets (12.5-25 mg total) by mouth 3 (three) times daily as needed for dizziness. (Patient not taking: Reported on 01/16/2023) 30 tablet 0   melatonin 1 MG TABS tablet Take 3 mg by  mouth at bedtime.     No current facility-administered medications on file prior to visit.    Review of Systems:  As per HPI- otherwise negative.   Physical Examination: There were no vitals filed for this visit. There were no vitals filed for this visit. There is no height or weight on file to calculate BMI. Ideal Body Weight:    GEN: no acute distress. HEENT: Atraumatic, Normocephalic.  Ears and Nose: No external deformity. CV: RRR, No M/G/R. No JVD. No thrill. No extra heart sounds. PULM: CTA B, no wheezes, crackles, rhonchi. No retractions. No resp. distress. No accessory muscle use. ABD: S, NT, ND, +BS. No rebound. No HSM. EXTR: No c/c/e PSYCH: Normally interactive. Conversant.    Assessment and Plan: *** Patient seen today for physical exam.  Encouraged healthy diet and exercise routine Will plan further follow- up pending labs.  Signed Abbe Amsterdam, MD

## 2024-02-24 ENCOUNTER — Ambulatory Visit (INDEPENDENT_AMBULATORY_CARE_PROVIDER_SITE_OTHER): Payer: BLUE CROSS/BLUE SHIELD | Admitting: Family Medicine

## 2024-02-24 ENCOUNTER — Other Ambulatory Visit (HOSPITAL_BASED_OUTPATIENT_CLINIC_OR_DEPARTMENT_OTHER): Payer: Self-pay

## 2024-02-24 ENCOUNTER — Other Ambulatory Visit (HOSPITAL_COMMUNITY)
Admission: RE | Admit: 2024-02-24 | Discharge: 2024-02-24 | Disposition: A | Discharge status: Home / Self Care | Payer: BLUE CROSS/BLUE SHIELD | Source: Ambulatory Visit | Attending: Family Medicine | Admitting: Family Medicine

## 2024-02-24 VITALS — BP 118/80 | HR 74 | Temp 98.1°F | Resp 18 | Ht 62.0 in | Wt 141.6 lb

## 2024-02-24 DIAGNOSIS — Z124 Encounter for screening for malignant neoplasm of cervix: Secondary | ICD-10-CM | POA: Diagnosis present

## 2024-02-24 DIAGNOSIS — Z131 Encounter for screening for diabetes mellitus: Secondary | ICD-10-CM

## 2024-02-24 DIAGNOSIS — R5383 Other fatigue: Secondary | ICD-10-CM

## 2024-02-24 DIAGNOSIS — Z1329 Encounter for screening for other suspected endocrine disorder: Secondary | ICD-10-CM

## 2024-02-24 DIAGNOSIS — Z23 Encounter for immunization: Secondary | ICD-10-CM | POA: Diagnosis not present

## 2024-02-24 DIAGNOSIS — Z130 Encounter for screening for diseases of the blood and blood-forming organs and certain disorders involving the immune mechanism: Secondary | ICD-10-CM | POA: Diagnosis not present

## 2024-02-24 DIAGNOSIS — R030 Elevated blood-pressure reading, without diagnosis of hypertension: Secondary | ICD-10-CM

## 2024-02-24 DIAGNOSIS — I10 Essential (primary) hypertension: Secondary | ICD-10-CM | POA: Diagnosis not present

## 2024-02-24 DIAGNOSIS — Z0000 Encounter for general adult medical examination without abnormal findings: Secondary | ICD-10-CM

## 2024-02-24 DIAGNOSIS — Z13220 Encounter for screening for lipoid disorders: Secondary | ICD-10-CM

## 2024-02-24 MED ORDER — AMLODIPINE BESYLATE 5 MG PO TABS
5.0000 mg | ORAL_TABLET | Freq: Every day | ORAL | 3 refills | Status: AC
  Filled 2024-02-24: qty 90, 90d supply, fill #0
  Filled 2024-02-27 – 2024-07-31 (×2): qty 30, 30d supply, fill #0
  Filled 2024-08-03 – 2024-08-08 (×2): qty 30, 30d supply, fill #1
  Filled 2024-12-06: qty 30, 30d supply, fill #2
  Filled 2025-01-11: qty 90, 90d supply, fill #3

## 2024-02-24 MED ORDER — AMLODIPINE BESYLATE 5 MG PO TABS
5.0000 mg | ORAL_TABLET | Freq: Two times a day (BID) | ORAL | 0 refills | Status: DC
  Filled 2024-02-24: qty 180, 90d supply, fill #0

## 2024-02-25 ENCOUNTER — Encounter: Payer: Self-pay | Admitting: Family Medicine

## 2024-02-25 LAB — CYTOLOGY - PAP
Comment: NEGATIVE
Diagnosis: NEGATIVE
High risk HPV: NEGATIVE

## 2024-02-29 ENCOUNTER — Other Ambulatory Visit (HOSPITAL_BASED_OUTPATIENT_CLINIC_OR_DEPARTMENT_OTHER): Payer: Self-pay

## 2024-08-01 ENCOUNTER — Other Ambulatory Visit (HOSPITAL_BASED_OUTPATIENT_CLINIC_OR_DEPARTMENT_OTHER): Payer: Self-pay

## 2024-08-02 ENCOUNTER — Other Ambulatory Visit (HOSPITAL_BASED_OUTPATIENT_CLINIC_OR_DEPARTMENT_OTHER): Payer: Self-pay | Admitting: Family Medicine

## 2024-08-02 DIAGNOSIS — Z1231 Encounter for screening mammogram for malignant neoplasm of breast: Secondary | ICD-10-CM

## 2024-08-04 ENCOUNTER — Other Ambulatory Visit (HOSPITAL_BASED_OUTPATIENT_CLINIC_OR_DEPARTMENT_OTHER): Payer: Self-pay

## 2024-08-08 ENCOUNTER — Ambulatory Visit (HOSPITAL_BASED_OUTPATIENT_CLINIC_OR_DEPARTMENT_OTHER)
Admission: RE | Admit: 2024-08-08 | Discharge: 2024-08-08 | Disposition: A | Discharge status: Home / Self Care | Source: Ambulatory Visit | Attending: Family Medicine | Admitting: Family Medicine

## 2024-08-08 ENCOUNTER — Other Ambulatory Visit (HOSPITAL_BASED_OUTPATIENT_CLINIC_OR_DEPARTMENT_OTHER): Payer: Self-pay

## 2024-08-08 ENCOUNTER — Encounter (HOSPITAL_BASED_OUTPATIENT_CLINIC_OR_DEPARTMENT_OTHER): Payer: Self-pay

## 2024-08-08 DIAGNOSIS — Z1231 Encounter for screening mammogram for malignant neoplasm of breast: Secondary | ICD-10-CM | POA: Insufficient documentation

## 2024-12-07 ENCOUNTER — Other Ambulatory Visit (HOSPITAL_BASED_OUTPATIENT_CLINIC_OR_DEPARTMENT_OTHER): Payer: Self-pay

## 2024-12-08 ENCOUNTER — Other Ambulatory Visit (HOSPITAL_BASED_OUTPATIENT_CLINIC_OR_DEPARTMENT_OTHER): Payer: Self-pay

## 2025-01-11 ENCOUNTER — Other Ambulatory Visit (HOSPITAL_BASED_OUTPATIENT_CLINIC_OR_DEPARTMENT_OTHER): Payer: Self-pay
